# Patient Record
Sex: Female | Born: 1935 | Race: White | Hispanic: No | Smoking: Former smoker
Health system: Southern US, Community
[De-identification: ages and names within clinical notes are randomized; demographics above are authoritative.]

## PROBLEM LIST (undated history)

## (undated) DIAGNOSIS — I10 Essential (primary) hypertension: Secondary | ICD-10-CM

## (undated) DIAGNOSIS — Z860100 Personal history of colon polyps, unspecified: Secondary | ICD-10-CM

## (undated) DIAGNOSIS — F419 Anxiety disorder, unspecified: Secondary | ICD-10-CM

## (undated) DIAGNOSIS — M8580 Other specified disorders of bone density and structure, unspecified site: Secondary | ICD-10-CM

## (undated) DIAGNOSIS — M3500 Sicca syndrome, unspecified: Secondary | ICD-10-CM

## (undated) DIAGNOSIS — E785 Hyperlipidemia, unspecified: Secondary | ICD-10-CM

## (undated) DIAGNOSIS — Z780 Asymptomatic menopausal state: Secondary | ICD-10-CM

## (undated) HISTORY — DX: Other specified disorders of bone density and structure, unspecified site: M85.80

## (undated) HISTORY — DX: Essential (primary) hypertension: I10

## (undated) HISTORY — PX: BREAST LUMPECTOMY: SHX2

## (undated) HISTORY — DX: Sjogren syndrome, unspecified: M35.00

## (undated) HISTORY — DX: Anxiety disorder, unspecified: F41.9

## (undated) HISTORY — DX: Personal history of colonic polyps: Z86.010

## (undated) HISTORY — PX: TONSILLECTOMY: SUR1361

## (undated) HISTORY — PX: VAGINAL HYSTERECTOMY: SUR661

## (undated) HISTORY — DX: Personal history of colon polyps, unspecified: Z86.0100

## (undated) HISTORY — DX: Asymptomatic menopausal state: Z78.0

## (undated) HISTORY — DX: Hyperlipidemia, unspecified: E78.5

---

## 1998-01-28 ENCOUNTER — Other Ambulatory Visit: Admission: RE | Admit: 1998-01-28 | Discharge: 1998-01-28 | Payer: Self-pay | Admitting: Gastroenterology

## 1998-10-13 ENCOUNTER — Other Ambulatory Visit: Admission: RE | Admit: 1998-10-13 | Discharge: 1998-10-13 | Payer: Self-pay | Admitting: Family Medicine

## 1999-04-06 ENCOUNTER — Encounter: Payer: Self-pay | Admitting: Family Medicine

## 1999-04-06 ENCOUNTER — Encounter: Admission: RE | Admit: 1999-04-06 | Discharge: 1999-04-06 | Payer: Self-pay | Admitting: Family Medicine

## 2000-02-28 ENCOUNTER — Other Ambulatory Visit: Admission: RE | Admit: 2000-02-28 | Discharge: 2000-02-28 | Payer: Self-pay | Admitting: Family Medicine

## 2000-06-21 ENCOUNTER — Encounter: Payer: Self-pay | Admitting: *Deleted

## 2000-06-21 ENCOUNTER — Encounter: Admission: RE | Admit: 2000-06-21 | Discharge: 2000-06-21 | Payer: Self-pay | Admitting: *Deleted

## 2001-03-08 ENCOUNTER — Encounter: Payer: Self-pay | Admitting: *Deleted

## 2001-03-08 ENCOUNTER — Encounter: Admission: RE | Admit: 2001-03-08 | Discharge: 2001-03-08 | Payer: Self-pay | Admitting: *Deleted

## 2001-07-15 ENCOUNTER — Encounter: Payer: Self-pay | Admitting: *Deleted

## 2001-07-15 ENCOUNTER — Encounter: Admission: RE | Admit: 2001-07-15 | Discharge: 2001-07-15 | Payer: Self-pay | Admitting: *Deleted

## 2002-10-13 ENCOUNTER — Encounter: Admission: RE | Admit: 2002-10-13 | Discharge: 2002-10-13 | Payer: Self-pay | Admitting: Family Medicine

## 2002-10-13 ENCOUNTER — Encounter: Payer: Self-pay | Admitting: Family Medicine

## 2003-10-14 ENCOUNTER — Encounter: Admission: RE | Admit: 2003-10-14 | Discharge: 2003-10-14 | Payer: Self-pay | Admitting: Family Medicine

## 2005-03-09 ENCOUNTER — Encounter: Admission: RE | Admit: 2005-03-09 | Discharge: 2005-03-09 | Payer: Self-pay | Admitting: Family Medicine

## 2006-03-12 ENCOUNTER — Encounter: Admission: RE | Admit: 2006-03-12 | Discharge: 2006-03-12 | Payer: Self-pay | Admitting: Family Medicine

## 2007-04-19 ENCOUNTER — Encounter: Admission: RE | Admit: 2007-04-19 | Discharge: 2007-04-19 | Payer: Self-pay | Admitting: Family Medicine

## 2009-01-13 ENCOUNTER — Encounter: Admission: RE | Admit: 2009-01-13 | Discharge: 2009-01-13 | Payer: Self-pay | Admitting: Family Medicine

## 2009-05-01 HISTORY — PX: FOOT SURGERY: SHX648

## 2009-08-22 ENCOUNTER — Inpatient Hospital Stay (HOSPITAL_COMMUNITY): Admission: EM | Admit: 2009-08-22 | Discharge: 2009-08-28 | Payer: Self-pay | Admitting: Emergency Medicine

## 2009-09-09 ENCOUNTER — Inpatient Hospital Stay (HOSPITAL_COMMUNITY): Admission: RE | Admit: 2009-09-09 | Discharge: 2009-09-12 | Payer: Self-pay | Admitting: Orthopedic Surgery

## 2010-01-14 ENCOUNTER — Encounter: Admission: RE | Admit: 2010-01-14 | Discharge: 2010-01-14 | Payer: Self-pay | Admitting: Family Medicine

## 2010-05-22 ENCOUNTER — Encounter: Payer: Self-pay | Admitting: Family Medicine

## 2010-07-19 LAB — COMPREHENSIVE METABOLIC PANEL
Albumin: 3.6 g/dL (ref 3.5–5.2)
BUN: 17 mg/dL (ref 6–23)
Chloride: 101 mEq/L (ref 96–112)
Creatinine, Ser: 0.75 mg/dL (ref 0.4–1.2)
Total Bilirubin: 0.8 mg/dL (ref 0.3–1.2)

## 2010-07-19 LAB — TYPE AND SCREEN: Antibody Screen: NEGATIVE

## 2010-07-19 LAB — BASIC METABOLIC PANEL
BUN: 5 mg/dL — ABNORMAL LOW (ref 6–23)
BUN: 7 mg/dL (ref 6–23)
BUN: 13 mg/dL (ref 6–23)
CO2: 26 mEq/L (ref 19–32)
CO2: 26 mEq/L (ref 19–32)
Calcium: 7.8 mg/dL — ABNORMAL LOW (ref 8.4–10.5)
Calcium: 7.9 mg/dL — ABNORMAL LOW (ref 8.4–10.5)
Chloride: 106 mEq/L (ref 96–112)
GFR calc non Af Amer: >60 mL/min (ref >60)
GFR calc non Af Amer: >60 mL/min (ref >60)
GFR calc non Af Amer: >60 mL/min (ref >60)
Glucose, Bld: 107 mg/dL — ABNORMAL HIGH (ref 70–99)
Glucose, Bld: 117 mg/dL — ABNORMAL HIGH (ref 70–99)
Glucose, Bld: 149 mg/dL — ABNORMAL HIGH (ref 70–99)
Potassium: 3.9 mEq/L (ref 3.5–5.1)
Potassium: 4.1 mEq/L (ref 3.5–5.1)
Sodium: 136 mEq/L (ref 135–145)
Sodium: 137 mEq/L (ref 135–145)

## 2010-07-19 LAB — CBC
HCT: 28.4 % — ABNORMAL LOW (ref 36.0–46.0)
HCT: 30.1 % — ABNORMAL LOW (ref 36.0–46.0)
HCT: 37.9 % (ref 36.0–46.0)
HCT: 33.1 % — ABNORMAL LOW (ref 36.0–46.0)
HCT: 36.1 % (ref 36.0–46.0)
Hemoglobin: 13 g/dL (ref 12.0–15.0)
Hemoglobin: 11.6 g/dL — ABNORMAL LOW (ref 12.0–15.0)
Hemoglobin: 12.5 g/dL (ref 12.0–15.0)
MCHC: 34.3 g/dL (ref 30.0–36.0)
MCV: 94.3 fL (ref 78.0–100.0)
Platelets: 217 10*3/uL (ref 150–400)
Platelets: 236 10*3/uL (ref 150–400)
RBC: 4.01 MIL/uL (ref 3.87–5.11)
RBC: 3.82 MIL/uL — ABNORMAL LOW (ref 3.87–5.11)
RDW: 13 % (ref 11.5–15.5)
RDW: 13.1 % (ref 11.5–15.5)
RDW: 13.6 % (ref 11.5–15.5)
RDW: 13.7 % (ref 11.5–15.5)
WBC: 7.5 10*3/uL (ref 4.0–10.5)

## 2010-07-19 LAB — POCT I-STAT, CHEM 8
BUN: 21 mg/dL (ref 6–23)
Chloride: 105 mEq/L (ref 96–112)
Creatinine, Ser: 0.8 mg/dL (ref 0.4–1.2)
Glucose, Bld: 145 mg/dL — ABNORMAL HIGH (ref 70–99)
Hemoglobin: 12.9 g/dL (ref 12.0–15.0)
Potassium: 4.1 mEq/L (ref 3.5–5.1)
Sodium: 138 mEq/L (ref 135–145)

## 2010-07-19 LAB — PROTIME-INR
INR: 1.02 (ref 0.00–1.49)
Prothrombin Time: 13.3 seconds (ref 11.6–15.2)

## 2010-07-19 LAB — DIFFERENTIAL
Basophils Absolute: 0 10*3/uL (ref 0.0–0.1)
Eosinophils Relative: 3 % (ref 0–5)
Lymphocytes Relative: 13 % (ref 12–46)
Lymphocytes Relative: 13 % (ref 12–46)
Lymphs Abs: 0.7 10*3/uL (ref 0.7–4.0)
Monocytes Absolute: 0.5 10*3/uL (ref 0.1–1.0)
Monocytes Absolute: 0.6 10*3/uL (ref 0.1–1.0)
Monocytes Relative: 12 % (ref 3–12)
Neutro Abs: 4.4 10*3/uL (ref 1.7–7.7)
Neutro Abs: 3.9 10*3/uL (ref 1.7–7.7)
Neutrophils Relative %: 75 % (ref 43–77)

## 2010-07-19 LAB — APTT: aPTT: 25 seconds (ref 24–37)

## 2010-07-19 LAB — URINALYSIS, ROUTINE W REFLEX MICROSCOPIC
Ketones, ur: NEGATIVE mg/dL
Nitrite: NEGATIVE
Protein, ur: NEGATIVE mg/dL
Urobilinogen, UA: 0.2 mg/dL (ref 0.0–1.0)
pH: 5.5 (ref 5.0–8.0)

## 2010-07-19 LAB — URINE CULTURE: Colony Count: 9000

## 2010-12-13 ENCOUNTER — Other Ambulatory Visit: Payer: Self-pay | Admitting: Family Medicine

## 2010-12-13 DIAGNOSIS — Z1231 Encounter for screening mammogram for malignant neoplasm of breast: Secondary | ICD-10-CM

## 2010-12-23 ENCOUNTER — Encounter: Payer: Self-pay | Admitting: Internal Medicine

## 2010-12-26 ENCOUNTER — Encounter: Payer: Self-pay | Admitting: Internal Medicine

## 2011-01-17 ENCOUNTER — Ambulatory Visit (AMBULATORY_SURGERY_CENTER): Payer: Medicare Other | Admitting: *Deleted

## 2011-01-17 ENCOUNTER — Ambulatory Visit: Payer: Self-pay

## 2011-01-17 ENCOUNTER — Encounter: Payer: Self-pay | Admitting: Internal Medicine

## 2011-01-17 VITALS — Ht 61.0 in | Wt 114.0 lb

## 2011-01-17 DIAGNOSIS — Z1211 Encounter for screening for malignant neoplasm of colon: Secondary | ICD-10-CM

## 2011-01-17 MED ORDER — PEG-KCL-NACL-NASULF-NA ASC-C 100 G PO SOLR: 1.0000 | Freq: Once | ORAL | Status: AC

## 2011-01-27 ENCOUNTER — Ambulatory Visit
Admission: RE | Admit: 2011-01-27 | Discharge: 2011-01-27 | Disposition: A | Discharge status: Home / Self Care | Payer: Medicare Other | Source: Ambulatory Visit | Attending: Family Medicine | Admitting: Family Medicine

## 2011-01-27 DIAGNOSIS — Z1231 Encounter for screening mammogram for malignant neoplasm of breast: Secondary | ICD-10-CM

## 2011-01-31 ENCOUNTER — Encounter: Payer: Self-pay | Admitting: Internal Medicine

## 2011-01-31 ENCOUNTER — Ambulatory Visit (AMBULATORY_SURGERY_CENTER): Payer: Medicare Other | Admitting: Internal Medicine

## 2011-01-31 VITALS — BP 168/74 | HR 59 | Temp 96.6°F | Resp 28 | Ht 61.0 in | Wt 114.0 lb

## 2011-01-31 DIAGNOSIS — K648 Other hemorrhoids: Secondary | ICD-10-CM

## 2011-01-31 DIAGNOSIS — Z86010 Personal history of colonic polyps: Secondary | ICD-10-CM

## 2011-01-31 DIAGNOSIS — K5730 Diverticulosis of large intestine without perforation or abscess without bleeding: Secondary | ICD-10-CM

## 2011-01-31 DIAGNOSIS — Z1211 Encounter for screening for malignant neoplasm of colon: Secondary | ICD-10-CM

## 2011-01-31 DIAGNOSIS — Z800 Family history of malignant neoplasm of digestive organs: Secondary | ICD-10-CM

## 2011-01-31 DIAGNOSIS — D126 Benign neoplasm of colon, unspecified: Secondary | ICD-10-CM

## 2011-01-31 HISTORY — PX: COLONOSCOPY: SHX174

## 2011-01-31 MED ORDER — SODIUM CHLORIDE 0.9 % IV SOLN: 500.0000 mL | INTRAVENOUS | Status: DC

## 2011-01-31 NOTE — Progress Notes (Signed)
Pt tolerated the colon exam very well. maw 

## 2011-01-31 NOTE — Patient Instructions (Addendum)
One tiny polyp was removed. It looks benign. I will send a letter explaining the analysis after pathology report is sent to me. You have mild diverticulosis and small hemorrhoids also. Iva Boop, MD, [facility] Resume all medications.Information given on polyps,diverticulosis,hemorrhoids and high fiber diet.

## 2011-02-01 ENCOUNTER — Telehealth: Payer: Self-pay | Admitting: *Deleted

## 2011-02-01 NOTE — Telephone Encounter (Signed)

## 2011-02-07 ENCOUNTER — Encounter: Payer: Self-pay | Admitting: Internal Medicine

## 2011-02-07 NOTE — Progress Notes (Signed)
Quick Note:  Diminutive adenoma - repeat colonoscopy 7 years 2019 - if vigorous ______

## 2011-12-26 ENCOUNTER — Other Ambulatory Visit: Payer: Self-pay | Admitting: Family Medicine

## 2011-12-26 DIAGNOSIS — Z1231 Encounter for screening mammogram for malignant neoplasm of breast: Secondary | ICD-10-CM

## 2012-01-30 ENCOUNTER — Ambulatory Visit
Admission: RE | Admit: 2012-01-30 | Discharge: 2012-01-30 | Disposition: A | Discharge status: Home / Self Care | Payer: Medicare Other | Source: Ambulatory Visit | Attending: Family Medicine | Admitting: Family Medicine

## 2012-01-30 DIAGNOSIS — Z1231 Encounter for screening mammogram for malignant neoplasm of breast: Secondary | ICD-10-CM

## 2012-02-01 ENCOUNTER — Other Ambulatory Visit: Payer: Self-pay | Admitting: Family Medicine

## 2012-02-01 DIAGNOSIS — R928 Other abnormal and inconclusive findings on diagnostic imaging of breast: Secondary | ICD-10-CM

## 2012-02-07 ENCOUNTER — Ambulatory Visit
Admission: RE | Admit: 2012-02-07 | Discharge: 2012-02-07 | Disposition: A | Discharge status: Home / Self Care | Payer: Medicare Other | Source: Ambulatory Visit | Attending: Family Medicine | Admitting: Family Medicine

## 2012-02-07 DIAGNOSIS — R928 Other abnormal and inconclusive findings on diagnostic imaging of breast: Secondary | ICD-10-CM

## 2012-05-01 HISTORY — PX: BREAST BIOPSY: SHX20

## 2012-07-03 ENCOUNTER — Other Ambulatory Visit: Payer: Self-pay | Admitting: Family Medicine

## 2012-07-03 DIAGNOSIS — N6489 Other specified disorders of breast: Secondary | ICD-10-CM

## 2012-08-05 ENCOUNTER — Ambulatory Visit
Admission: RE | Admit: 2012-08-05 | Discharge: 2012-08-05 | Disposition: A | Discharge status: Home / Self Care | Payer: Medicare PPO | Source: Ambulatory Visit | Attending: Family Medicine | Admitting: Family Medicine

## 2012-08-05 DIAGNOSIS — N6489 Other specified disorders of breast: Secondary | ICD-10-CM

## 2013-01-08 ENCOUNTER — Other Ambulatory Visit: Payer: Self-pay

## 2013-01-08 DIAGNOSIS — Z1231 Encounter for screening mammogram for malignant neoplasm of breast: Secondary | ICD-10-CM

## 2013-01-30 ENCOUNTER — Ambulatory Visit
Admission: RE | Admit: 2013-01-30 | Discharge: 2013-01-30 | Disposition: A | Discharge status: Home / Self Care | Payer: Medicare PPO | Source: Ambulatory Visit

## 2013-01-30 DIAGNOSIS — Z1231 Encounter for screening mammogram for malignant neoplasm of breast: Secondary | ICD-10-CM

## 2013-02-03 ENCOUNTER — Other Ambulatory Visit: Payer: Self-pay | Admitting: Family Medicine

## 2013-02-03 DIAGNOSIS — R928 Other abnormal and inconclusive findings on diagnostic imaging of breast: Secondary | ICD-10-CM

## 2013-03-01 HISTORY — PX: BREAST LUMPECTOMY: SHX2

## 2013-03-06 ENCOUNTER — Other Ambulatory Visit: Payer: Self-pay | Admitting: Family Medicine

## 2013-03-06 ENCOUNTER — Ambulatory Visit
Admission: RE | Admit: 2013-03-06 | Discharge: 2013-03-06 | Disposition: A | Discharge status: Home / Self Care | Payer: Medicare PPO | Source: Ambulatory Visit | Attending: Family Medicine | Admitting: Family Medicine

## 2013-03-06 DIAGNOSIS — R928 Other abnormal and inconclusive findings on diagnostic imaging of breast: Secondary | ICD-10-CM

## 2013-03-06 DIAGNOSIS — N6310 Unspecified lump in the right breast, unspecified quadrant: Secondary | ICD-10-CM

## 2013-03-12 ENCOUNTER — Ambulatory Visit
Admission: RE | Admit: 2013-03-12 | Discharge: 2013-03-12 | Disposition: A | Discharge status: Home / Self Care | Payer: Medicare PPO | Source: Ambulatory Visit | Attending: Family Medicine | Admitting: Family Medicine

## 2013-03-12 ENCOUNTER — Other Ambulatory Visit: Payer: Self-pay | Admitting: Family Medicine

## 2013-03-12 DIAGNOSIS — N6310 Unspecified lump in the right breast, unspecified quadrant: Secondary | ICD-10-CM

## 2013-03-26 DIAGNOSIS — M3501 Sicca syndrome with keratoconjunctivitis: Secondary | ICD-10-CM | POA: Insufficient documentation

## 2013-03-26 DIAGNOSIS — I10 Essential (primary) hypertension: Secondary | ICD-10-CM | POA: Diagnosis present

## 2013-03-26 DIAGNOSIS — C50911 Malignant neoplasm of unspecified site of right female breast: Secondary | ICD-10-CM | POA: Insufficient documentation

## 2014-02-17 ENCOUNTER — Encounter: Payer: Self-pay | Admitting: *Deleted

## 2014-05-12 DIAGNOSIS — H3531 Nonexudative age-related macular degeneration: Secondary | ICD-10-CM | POA: Diagnosis not present

## 2014-05-13 DIAGNOSIS — C50911 Malignant neoplasm of unspecified site of right female breast: Secondary | ICD-10-CM | POA: Diagnosis not present

## 2014-05-13 DIAGNOSIS — Z9889 Other specified postprocedural states: Secondary | ICD-10-CM | POA: Diagnosis not present

## 2014-05-13 DIAGNOSIS — M8580 Other specified disorders of bone density and structure, unspecified site: Secondary | ICD-10-CM | POA: Diagnosis not present

## 2014-05-13 DIAGNOSIS — Z170 Estrogen receptor positive status [ER+]: Secondary | ICD-10-CM | POA: Diagnosis not present

## 2014-09-16 DIAGNOSIS — H5203 Hypermetropia, bilateral: Secondary | ICD-10-CM | POA: Diagnosis not present

## 2014-09-16 DIAGNOSIS — H5210 Myopia, unspecified eye: Secondary | ICD-10-CM | POA: Diagnosis not present

## 2014-09-16 DIAGNOSIS — Z79899 Other long term (current) drug therapy: Secondary | ICD-10-CM | POA: Diagnosis not present

## 2014-09-17 DIAGNOSIS — E559 Vitamin D deficiency, unspecified: Secondary | ICD-10-CM | POA: Diagnosis not present

## 2014-09-17 DIAGNOSIS — E780 Pure hypercholesterolemia: Secondary | ICD-10-CM | POA: Diagnosis not present

## 2014-09-17 DIAGNOSIS — N183 Chronic kidney disease, stage 3 (moderate): Secondary | ICD-10-CM | POA: Diagnosis not present

## 2014-09-17 DIAGNOSIS — M8588 Other specified disorders of bone density and structure, other site: Secondary | ICD-10-CM | POA: Diagnosis not present

## 2014-09-17 DIAGNOSIS — I10 Essential (primary) hypertension: Secondary | ICD-10-CM | POA: Diagnosis not present

## 2014-09-17 DIAGNOSIS — F419 Anxiety disorder, unspecified: Secondary | ICD-10-CM | POA: Diagnosis not present

## 2014-09-17 DIAGNOSIS — Z23 Encounter for immunization: Secondary | ICD-10-CM | POA: Diagnosis not present

## 2014-09-17 DIAGNOSIS — L570 Actinic keratosis: Secondary | ICD-10-CM | POA: Diagnosis not present

## 2014-09-18 DIAGNOSIS — M3500 Sicca syndrome, unspecified: Secondary | ICD-10-CM | POA: Diagnosis not present

## 2014-09-18 DIAGNOSIS — I7300 Raynaud's syndrome without gangrene: Secondary | ICD-10-CM | POA: Diagnosis not present

## 2014-09-18 DIAGNOSIS — M1990 Unspecified osteoarthritis, unspecified site: Secondary | ICD-10-CM | POA: Diagnosis not present

## 2014-09-18 DIAGNOSIS — Z79899 Other long term (current) drug therapy: Secondary | ICD-10-CM | POA: Diagnosis not present

## 2014-09-18 DIAGNOSIS — M2550 Pain in unspecified joint: Secondary | ICD-10-CM | POA: Diagnosis not present

## 2014-10-15 DIAGNOSIS — M899 Disorder of bone, unspecified: Secondary | ICD-10-CM | POA: Diagnosis not present

## 2014-10-15 DIAGNOSIS — M8589 Other specified disorders of bone density and structure, multiple sites: Secondary | ICD-10-CM | POA: Diagnosis not present

## 2014-11-10 DIAGNOSIS — H3531 Nonexudative age-related macular degeneration: Secondary | ICD-10-CM | POA: Diagnosis not present

## 2014-11-10 DIAGNOSIS — H43823 Vitreomacular adhesion, bilateral: Secondary | ICD-10-CM | POA: Diagnosis not present

## 2014-11-13 DIAGNOSIS — N644 Mastodynia: Secondary | ICD-10-CM | POA: Diagnosis not present

## 2014-11-13 DIAGNOSIS — Z9889 Other specified postprocedural states: Secondary | ICD-10-CM | POA: Diagnosis not present

## 2014-11-13 DIAGNOSIS — Z853 Personal history of malignant neoplasm of breast: Secondary | ICD-10-CM | POA: Diagnosis not present

## 2014-11-13 DIAGNOSIS — Z170 Estrogen receptor positive status [ER+]: Secondary | ICD-10-CM | POA: Diagnosis not present

## 2014-11-13 DIAGNOSIS — C50911 Malignant neoplasm of unspecified site of right female breast: Secondary | ICD-10-CM | POA: Diagnosis not present

## 2014-11-13 DIAGNOSIS — M8580 Other specified disorders of bone density and structure, unspecified site: Secondary | ICD-10-CM | POA: Diagnosis not present

## 2014-11-13 DIAGNOSIS — Z79811 Long term (current) use of aromatase inhibitors: Secondary | ICD-10-CM | POA: Diagnosis not present

## 2014-12-17 DIAGNOSIS — N3000 Acute cystitis without hematuria: Secondary | ICD-10-CM | POA: Diagnosis not present

## 2014-12-17 DIAGNOSIS — R300 Dysuria: Secondary | ICD-10-CM | POA: Diagnosis not present

## 2015-01-13 DIAGNOSIS — K111 Hypertrophy of salivary gland: Secondary | ICD-10-CM | POA: Diagnosis not present

## 2015-01-13 DIAGNOSIS — R350 Frequency of micturition: Secondary | ICD-10-CM | POA: Diagnosis not present

## 2015-01-13 DIAGNOSIS — Z23 Encounter for immunization: Secondary | ICD-10-CM | POA: Diagnosis not present

## 2015-01-13 DIAGNOSIS — N390 Urinary tract infection, site not specified: Secondary | ICD-10-CM | POA: Diagnosis not present

## 2015-03-02 DIAGNOSIS — N390 Urinary tract infection, site not specified: Secondary | ICD-10-CM | POA: Diagnosis not present

## 2015-03-02 DIAGNOSIS — R221 Localized swelling, mass and lump, neck: Secondary | ICD-10-CM | POA: Diagnosis not present

## 2015-03-10 DIAGNOSIS — K118 Other diseases of salivary glands: Secondary | ICD-10-CM | POA: Diagnosis not present

## 2015-03-10 DIAGNOSIS — M3500 Sicca syndrome, unspecified: Secondary | ICD-10-CM | POA: Diagnosis not present

## 2015-03-12 ENCOUNTER — Other Ambulatory Visit: Payer: Self-pay | Admitting: Otolaryngology

## 2015-03-12 DIAGNOSIS — M3500 Sicca syndrome, unspecified: Secondary | ICD-10-CM

## 2015-03-12 DIAGNOSIS — R609 Edema, unspecified: Secondary | ICD-10-CM

## 2015-03-17 DIAGNOSIS — Z79899 Other long term (current) drug therapy: Secondary | ICD-10-CM | POA: Diagnosis not present

## 2015-03-18 DIAGNOSIS — M3500 Sicca syndrome, unspecified: Secondary | ICD-10-CM | POA: Diagnosis not present

## 2015-03-18 DIAGNOSIS — K119 Disease of salivary gland, unspecified: Secondary | ICD-10-CM | POA: Diagnosis not present

## 2015-03-18 DIAGNOSIS — Z79899 Other long term (current) drug therapy: Secondary | ICD-10-CM | POA: Diagnosis not present

## 2015-03-18 DIAGNOSIS — I7300 Raynaud's syndrome without gangrene: Secondary | ICD-10-CM | POA: Diagnosis not present

## 2015-03-18 DIAGNOSIS — M7062 Trochanteric bursitis, left hip: Secondary | ICD-10-CM | POA: Diagnosis not present

## 2015-03-19 DIAGNOSIS — F419 Anxiety disorder, unspecified: Secondary | ICD-10-CM | POA: Diagnosis not present

## 2015-03-19 DIAGNOSIS — M8588 Other specified disorders of bone density and structure, other site: Secondary | ICD-10-CM | POA: Diagnosis not present

## 2015-03-19 DIAGNOSIS — M3500 Sicca syndrome, unspecified: Secondary | ICD-10-CM | POA: Diagnosis not present

## 2015-03-19 DIAGNOSIS — N183 Chronic kidney disease, stage 3 (moderate): Secondary | ICD-10-CM | POA: Diagnosis not present

## 2015-03-19 DIAGNOSIS — E7800 Pure hypercholesterolemia, unspecified: Secondary | ICD-10-CM | POA: Diagnosis not present

## 2015-03-19 DIAGNOSIS — I10 Essential (primary) hypertension: Secondary | ICD-10-CM | POA: Diagnosis not present

## 2015-03-22 ENCOUNTER — Ambulatory Visit
Admission: RE | Admit: 2015-03-22 | Discharge: 2015-03-22 | Disposition: A | Discharge status: Home / Self Care | Payer: Commercial Managed Care - HMO | Source: Ambulatory Visit | Attending: Otolaryngology | Admitting: Otolaryngology

## 2015-03-22 DIAGNOSIS — R609 Edema, unspecified: Secondary | ICD-10-CM

## 2015-03-22 DIAGNOSIS — M3500 Sicca syndrome, unspecified: Secondary | ICD-10-CM

## 2015-03-22 DIAGNOSIS — K1120 Sialoadenitis, unspecified: Secondary | ICD-10-CM | POA: Diagnosis not present

## 2015-03-22 MED ORDER — IOPAMIDOL (ISOVUE-300) INJECTION 61%
75.0000 mL | Freq: Once | INTRAVENOUS | Status: AC | PRN
  Administered 2015-03-22: 75 mL via INTRAVENOUS

## 2015-03-30 DIAGNOSIS — M3500 Sicca syndrome, unspecified: Secondary | ICD-10-CM | POA: Diagnosis not present

## 2015-03-30 DIAGNOSIS — K118 Other diseases of salivary glands: Secondary | ICD-10-CM | POA: Diagnosis not present

## 2015-05-20 DIAGNOSIS — Z79899 Other long term (current) drug therapy: Secondary | ICD-10-CM | POA: Diagnosis not present

## 2015-05-28 DIAGNOSIS — C50911 Malignant neoplasm of unspecified site of right female breast: Secondary | ICD-10-CM | POA: Diagnosis not present

## 2015-05-28 DIAGNOSIS — Z170 Estrogen receptor positive status [ER+]: Secondary | ICD-10-CM | POA: Diagnosis not present

## 2015-05-28 DIAGNOSIS — Z9889 Other specified postprocedural states: Secondary | ICD-10-CM | POA: Diagnosis not present

## 2015-05-28 DIAGNOSIS — M8580 Other specified disorders of bone density and structure, unspecified site: Secondary | ICD-10-CM | POA: Diagnosis not present

## 2015-05-28 DIAGNOSIS — C50012 Malignant neoplasm of nipple and areola, left female breast: Secondary | ICD-10-CM | POA: Diagnosis not present

## 2015-06-25 DIAGNOSIS — K118 Other diseases of salivary glands: Secondary | ICD-10-CM | POA: Diagnosis not present

## 2015-07-22 DIAGNOSIS — Z79899 Other long term (current) drug therapy: Secondary | ICD-10-CM | POA: Diagnosis not present

## 2015-07-28 DIAGNOSIS — I7300 Raynaud's syndrome without gangrene: Secondary | ICD-10-CM | POA: Diagnosis not present

## 2015-07-28 DIAGNOSIS — Z79899 Other long term (current) drug therapy: Secondary | ICD-10-CM | POA: Diagnosis not present

## 2015-07-28 DIAGNOSIS — K119 Disease of salivary gland, unspecified: Secondary | ICD-10-CM | POA: Diagnosis not present

## 2015-07-28 DIAGNOSIS — M3500 Sicca syndrome, unspecified: Secondary | ICD-10-CM | POA: Diagnosis not present

## 2015-08-23 DIAGNOSIS — I10 Essential (primary) hypertension: Secondary | ICD-10-CM | POA: Diagnosis not present

## 2015-08-25 DIAGNOSIS — R42 Dizziness and giddiness: Secondary | ICD-10-CM | POA: Diagnosis not present

## 2015-08-25 DIAGNOSIS — R5383 Other fatigue: Secondary | ICD-10-CM | POA: Diagnosis not present

## 2015-08-26 ENCOUNTER — Inpatient Hospital Stay (HOSPITAL_COMMUNITY)
Admission: EM | Admit: 2015-08-26 | Discharge: 2015-09-01 | DRG: 641 | Disposition: A | Discharge status: Skilled Nursing Facility | Payer: Commercial Managed Care - HMO | Attending: Internal Medicine | Admitting: Internal Medicine

## 2015-08-26 ENCOUNTER — Encounter (HOSPITAL_COMMUNITY): Payer: Self-pay | Admitting: *Deleted

## 2015-08-26 ENCOUNTER — Emergency Department (HOSPITAL_COMMUNITY): Payer: Commercial Managed Care - HMO

## 2015-08-26 DIAGNOSIS — W19XXXA Unspecified fall, initial encounter: Secondary | ICD-10-CM | POA: Diagnosis not present

## 2015-08-26 DIAGNOSIS — E785 Hyperlipidemia, unspecified: Secondary | ICD-10-CM | POA: Diagnosis present

## 2015-08-26 DIAGNOSIS — Z8041 Family history of malignant neoplasm of ovary: Secondary | ICD-10-CM | POA: Diagnosis not present

## 2015-08-26 DIAGNOSIS — W1830XA Fall on same level, unspecified, initial encounter: Secondary | ICD-10-CM | POA: Diagnosis present

## 2015-08-26 DIAGNOSIS — Z79899 Other long term (current) drug therapy: Secondary | ICD-10-CM | POA: Diagnosis not present

## 2015-08-26 DIAGNOSIS — F419 Anxiety disorder, unspecified: Secondary | ICD-10-CM | POA: Diagnosis present

## 2015-08-26 DIAGNOSIS — R627 Adult failure to thrive: Secondary | ICD-10-CM | POA: Diagnosis not present

## 2015-08-26 DIAGNOSIS — Z882 Allergy status to sulfonamides status: Secondary | ICD-10-CM

## 2015-08-26 DIAGNOSIS — M6281 Muscle weakness (generalized): Secondary | ICD-10-CM | POA: Diagnosis not present

## 2015-08-26 DIAGNOSIS — Z87891 Personal history of nicotine dependence: Secondary | ICD-10-CM | POA: Diagnosis not present

## 2015-08-26 DIAGNOSIS — Y92009 Unspecified place in unspecified non-institutional (private) residence as the place of occurrence of the external cause: Secondary | ICD-10-CM | POA: Diagnosis not present

## 2015-08-26 DIAGNOSIS — R296 Repeated falls: Secondary | ICD-10-CM | POA: Diagnosis present

## 2015-08-26 DIAGNOSIS — Z833 Family history of diabetes mellitus: Secondary | ICD-10-CM

## 2015-08-26 DIAGNOSIS — Z86010 Personal history of colonic polyps: Secondary | ICD-10-CM | POA: Diagnosis not present

## 2015-08-26 DIAGNOSIS — R2681 Unsteadiness on feet: Secondary | ICD-10-CM | POA: Diagnosis present

## 2015-08-26 DIAGNOSIS — E860 Dehydration: Secondary | ICD-10-CM | POA: Diagnosis not present

## 2015-08-26 DIAGNOSIS — R278 Other lack of coordination: Secondary | ICD-10-CM | POA: Diagnosis not present

## 2015-08-26 DIAGNOSIS — Z8249 Family history of ischemic heart disease and other diseases of the circulatory system: Secondary | ICD-10-CM | POA: Diagnosis not present

## 2015-08-26 DIAGNOSIS — M8580 Other specified disorders of bone density and structure, unspecified site: Secondary | ICD-10-CM | POA: Diagnosis present

## 2015-08-26 DIAGNOSIS — R739 Hyperglycemia, unspecified: Secondary | ICD-10-CM | POA: Diagnosis present

## 2015-08-26 DIAGNOSIS — M3500 Sicca syndrome, unspecified: Secondary | ICD-10-CM | POA: Diagnosis present

## 2015-08-26 DIAGNOSIS — M069 Rheumatoid arthritis, unspecified: Secondary | ICD-10-CM | POA: Diagnosis not present

## 2015-08-26 DIAGNOSIS — R262 Difficulty in walking, not elsewhere classified: Secondary | ICD-10-CM | POA: Diagnosis not present

## 2015-08-26 DIAGNOSIS — D72829 Elevated white blood cell count, unspecified: Secondary | ICD-10-CM | POA: Diagnosis present

## 2015-08-26 DIAGNOSIS — E876 Hypokalemia: Secondary | ICD-10-CM | POA: Diagnosis not present

## 2015-08-26 DIAGNOSIS — S0990XA Unspecified injury of head, initial encounter: Secondary | ICD-10-CM | POA: Diagnosis not present

## 2015-08-26 DIAGNOSIS — I10 Essential (primary) hypertension: Secondary | ICD-10-CM | POA: Diagnosis not present

## 2015-08-26 DIAGNOSIS — E871 Hypo-osmolality and hyponatremia: Secondary | ICD-10-CM | POA: Diagnosis not present

## 2015-08-26 DIAGNOSIS — R531 Weakness: Secondary | ICD-10-CM

## 2015-08-26 DIAGNOSIS — R404 Transient alteration of awareness: Secondary | ICD-10-CM | POA: Diagnosis not present

## 2015-08-26 DIAGNOSIS — R42 Dizziness and giddiness: Secondary | ICD-10-CM | POA: Diagnosis not present

## 2015-08-26 DIAGNOSIS — Y92099 Unspecified place in other non-institutional residence as the place of occurrence of the external cause: Secondary | ICD-10-CM

## 2015-08-26 LAB — URINALYSIS, ROUTINE W REFLEX MICROSCOPIC
BILIRUBIN URINE: NEGATIVE
Glucose, UA: 250 mg/dL — AB
KETONES UR: NEGATIVE mg/dL
NITRITE: NEGATIVE
PROTEIN: NEGATIVE mg/dL
SPECIFIC GRAVITY, URINE: 1.018 (ref 1.005–1.030)
pH: 6 (ref 5.0–8.0)

## 2015-08-26 LAB — URINE MICROSCOPIC-ADD ON: Squamous Epithelial / LPF: NONE SEEN

## 2015-08-26 LAB — CBG MONITORING, ED: GLUCOSE-CAPILLARY: 158 mg/dL — AB (ref 65–99)

## 2015-08-26 LAB — CBC
HCT: 35.7 % — ABNORMAL LOW (ref 36.0–46.0)
HEMOGLOBIN: 13.1 g/dL (ref 12.0–15.0)
MCH: 30.8 pg (ref 26.0–34.0)
MCHC: 36.7 g/dL — AB (ref 30.0–36.0)
MCV: 83.8 fL (ref 78.0–100.0)
PLATELETS: 173 10*3/uL (ref 150–400)
RBC: 4.26 MIL/uL (ref 3.87–5.11)
RDW: 12.4 % (ref 11.5–15.5)
WBC: 12.2 10*3/uL — ABNORMAL HIGH (ref 4.0–10.5)

## 2015-08-26 LAB — MAGNESIUM: MAGNESIUM: 1.7 mg/dL (ref 1.7–2.4)

## 2015-08-26 LAB — OSMOLALITY: OSMOLALITY: 243 mosm/kg — AB (ref 275–295)

## 2015-08-26 LAB — BASIC METABOLIC PANEL
ANION GAP: 15 (ref 5–15)
BUN: 25 mg/dL — AB (ref 6–20)
CALCIUM: 8.4 mg/dL — AB (ref 8.9–10.3)
CO2: 25 mmol/L (ref 22–32)
CREATININE: 0.79 mg/dL (ref 0.44–1.00)
Chloride: 75 mmol/L — ABNORMAL LOW (ref 101–111)
GFR calc Af Amer: >60 mL/min (ref >60)
GLUCOSE: 152 mg/dL — AB (ref 65–99)
Potassium: 2.1 mmol/L — CL (ref 3.5–5.1)
Sodium: 115 mmol/L — CL (ref 135–145)

## 2015-08-26 MED ORDER — ONDANSETRON HCL 4 MG/2ML IJ SOLN
4.0000 mg | Freq: Four times a day (QID) | INTRAMUSCULAR | Status: DC | PRN
  Administered 2015-08-26: 4 mg via INTRAVENOUS
  Filled 2015-08-26: qty 2

## 2015-08-26 MED ORDER — CYCLOSPORINE 0.05 % OP EMUL
1.0000 [drp] | Freq: Two times a day (BID) | OPHTHALMIC | Status: DC
  Administered 2015-08-27 – 2015-08-31 (×9): 1 [drp] via OPHTHALMIC
  Filled 2015-08-26 (×13): qty 1

## 2015-08-26 MED ORDER — NYSTATIN 100000 UNIT/ML MT SUSP
5.0000 mL | Freq: Three times a day (TID) | OROMUCOSAL | Status: DC
  Administered 2015-08-26 – 2015-09-01 (×21): 500000 [IU] via ORAL
  Filled 2015-08-26 (×22): qty 5

## 2015-08-26 MED ORDER — METOPROLOL TARTRATE 50 MG PO TABS
100.0000 mg | ORAL_TABLET | Freq: Two times a day (BID) | ORAL | Status: DC
  Administered 2015-08-26 – 2015-09-01 (×12): 100 mg via ORAL
  Filled 2015-08-26 (×12): qty 2

## 2015-08-26 MED ORDER — ALPRAZOLAM 0.25 MG PO TABS
0.2500 mg | ORAL_TABLET | Freq: Every evening | ORAL | Status: DC | PRN
  Administered 2015-08-26 – 2015-08-28 (×3): 0.25 mg via ORAL
  Filled 2015-08-26 (×3): qty 1

## 2015-08-26 MED ORDER — ACETAMINOPHEN 650 MG RE SUPP: 650.0000 mg | Freq: Four times a day (QID) | RECTAL | Status: DC | PRN

## 2015-08-26 MED ORDER — SODIUM CHLORIDE 0.9 % IV SOLN
INTRAVENOUS | Status: DC
  Administered 2015-08-26 – 2015-08-31 (×9): via INTRAVENOUS

## 2015-08-26 MED ORDER — SODIUM CHLORIDE 0.9 % IV SOLN
Freq: Once | INTRAVENOUS | Status: AC
  Administered 2015-08-26: 12:00:00 via INTRAVENOUS

## 2015-08-26 MED ORDER — POTASSIUM CHLORIDE 10 MEQ/100ML IV SOLN
10.0000 meq | Freq: Once | INTRAVENOUS | Status: AC
  Administered 2015-08-26: 10 meq via INTRAVENOUS
  Filled 2015-08-26: qty 100

## 2015-08-26 MED ORDER — ACETAMINOPHEN 325 MG PO TABS: 650.0000 mg | ORAL_TABLET | Freq: Four times a day (QID) | ORAL | Status: DC | PRN

## 2015-08-26 MED ORDER — SODIUM CHLORIDE 0.9% FLUSH
3.0000 mL | Freq: Two times a day (BID) | INTRAVENOUS | Status: DC
  Administered 2015-08-27 – 2015-08-30 (×5): 3 mL via INTRAVENOUS

## 2015-08-26 MED ORDER — RAMIPRIL 10 MG PO CAPS
10.0000 mg | ORAL_CAPSULE | Freq: Every day | ORAL | Status: DC
  Administered 2015-08-26 – 2015-09-01 (×7): 10 mg via ORAL
  Filled 2015-08-26 (×7): qty 1

## 2015-08-26 MED ORDER — HYDROXYCHLOROQUINE SULFATE 200 MG PO TABS
200.0000 mg | ORAL_TABLET | Freq: Every day | ORAL | Status: DC
  Administered 2015-08-26 – 2015-09-01 (×7): 200 mg via ORAL
  Filled 2015-08-26 (×7): qty 1

## 2015-08-26 MED ORDER — CALCIUM 1200 1200-1000 MG-UNIT PO CHEW: CHEWABLE_TABLET | Freq: Every day | ORAL | Status: DC

## 2015-08-26 MED ORDER — POTASSIUM CHLORIDE CRYS ER 20 MEQ PO TBCR
40.0000 meq | EXTENDED_RELEASE_TABLET | Freq: Once | ORAL | Status: AC
  Administered 2015-08-26: 40 meq via ORAL
  Filled 2015-08-26: qty 2

## 2015-08-26 MED ORDER — ENOXAPARIN SODIUM 40 MG/0.4ML ~~LOC~~ SOLN
40.0000 mg | SUBCUTANEOUS | Status: DC
  Administered 2015-08-26 – 2015-08-31 (×6): 40 mg via SUBCUTANEOUS
  Filled 2015-08-26 (×6): qty 0.4

## 2015-08-26 MED ORDER — POTASSIUM CHLORIDE 10 MEQ/100ML IV SOLN
10.0000 meq | INTRAVENOUS | Status: AC
  Administered 2015-08-26 (×6): 10 meq via INTRAVENOUS
  Filled 2015-08-26 (×5): qty 100

## 2015-08-26 MED ORDER — CALCIUM CARBONATE ANTACID 500 MG PO CHEW
1.0000 | CHEWABLE_TABLET | Freq: Every day | ORAL | Status: DC
  Administered 2015-08-27 – 2015-09-01 (×6): 200 mg via ORAL
  Filled 2015-08-26 (×6): qty 1
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
## 2015-08-26 NOTE — ED Notes (Signed)
Per EMS pt from home with c/o generalized weakness, nausea x 6 days, was seen by PCP for same, this morning got out of bed, felt weak and fell hitting head, there is swelling to left side of head, denies pain.

## 2015-08-26 NOTE — Progress Notes (Signed)
CRITICAL VALUE ALERT  Critical value received: Serum Osmolality 243   Date of notification:  08/26/15  Time of notification:  2105  Critical value read back:yes  Nurse who received alert:  Harlene Ramus  MD notified (1st page):  [place]  Time of first page: 2105  MD notified (2nd page):   Time of second page:  Responding MD:  Rachelle Hora  Time MD responded:  2105
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
## 2015-08-26 NOTE — ED Notes (Signed)
PT have been made aware of urine sample 

## 2015-08-26 NOTE — ED Notes (Signed)
Bed: WA04 Expected date:  Expected time:  Means of arrival:  Comments: EMS-syncope

## 2015-08-26 NOTE — ED Notes (Signed)
PT state she will not be able to walk to restroom and request for bedpan

## 2015-08-27 LAB — BASIC METABOLIC PANEL
Anion gap: 8 (ref 5–15)
BUN: 15 mg/dL (ref 6–20)
CO2: 24 mmol/L (ref 22–32)
CREATININE: 0.6 mg/dL (ref 0.44–1.00)
Calcium: 7.8 mg/dL — ABNORMAL LOW (ref 8.9–10.3)
Chloride: 86 mmol/L — ABNORMAL LOW (ref 101–111)
GFR calc Af Amer: >60 mL/min (ref >60)
GLUCOSE: 108 mg/dL — AB (ref 65–99)
POTASSIUM: 3.5 mmol/L (ref 3.5–5.1)
SODIUM: 118 mmol/L — AB (ref 135–145)

## 2015-08-27 LAB — CBC
HEMATOCRIT: 34.7 % — AB (ref 36.0–46.0)
Hemoglobin: 12.6 g/dL (ref 12.0–15.0)
MCH: 30.8 pg (ref 26.0–34.0)
MCHC: 36.3 g/dL — AB (ref 30.0–36.0)
MCV: 84.8 fL (ref 78.0–100.0)
Platelets: 171 10*3/uL (ref 150–400)
RBC: 4.09 MIL/uL (ref 3.87–5.11)
RDW: 12.6 % (ref 11.5–15.5)
WBC: 12.2 10*3/uL — AB (ref 4.0–10.5)

## 2015-08-27 LAB — SODIUM, URINE, RANDOM: SODIUM UR: 21 mmol/L

## 2015-08-27 LAB — OSMOLALITY, URINE: Osmolality, Ur: 557 mOsm/kg (ref 300–900)

## 2015-08-27 MED ORDER — POTASSIUM CHLORIDE CRYS ER 20 MEQ PO TBCR
40.0000 meq | EXTENDED_RELEASE_TABLET | Freq: Once | ORAL | Status: AC
  Administered 2015-08-27: 40 meq via ORAL
  Filled 2015-08-27: qty 2

## 2015-08-27 NOTE — Evaluation (Signed)
Physical Therapy Evaluation Patient Details Name: Sheila Pugh MRN: [id number] DOB: Jul 04, 1935 Today's Date: 08/27/2015   History of Present Illness  Sheila Pugh is a 80 year old female with a history of hypertension, dyslipidemia, admitted to the medicine service on 08/26/2015 when she presented with complaints of generalized weakness, recurrent falls, failure to thrive  Clinical Impression  Pt admitted with above diagnosis. Pt currently with functional limitations due to the deficits listed below (see PT Problem List).  Pt will benefit from skilled PT to increase their independence and safety with mobility to allow discharge to the venue listed below.   Pt quite agreeable to therapy, movements slow, guarded, requiring min assist throughout for balance; pt reports she feels like she is "in a fog", pt reports she  had ambien last night and she normally does not take this at home; recommend HHPT, may need incr support at home initially depending on progress--has supportive family/sisters able to stay if needed; fall risk--will continue to follow     Follow Up Recommendations Home health PT;Supervision for mobility/OOB (may need initial supervision/assist depending on progress)    Equipment Recommendations    none   Recommendations for Other Services       Precautions / Restrictions Precautions Precautions: Fall Restrictions Weight Bearing Restrictions: No      Mobility  Bed Mobility Overal bed mobility: Needs Assistance Bed Mobility: Supine to Sit     Supine to sit: Min guard     General bed mobility comments: incr time, cues to self assist, close guarding for safety, near posterior LOB  multiple times with scooting  Transfers Overall transfer level: Needs assistance Equipment used: Rolling walker (2 wheeled);None Transfers: Sit to/from Stand Sit to Stand: Min assist         General transfer comment: assist to rise and stabilize, posterior  LOB  Ambulation/Gait Ambulation/Gait assistance: Min assist Ambulation Distance (Feet): 50 Feet Assistive device: Rolling walker (2 wheeled) Gait Pattern/deviations: Decreased stride length;Drifts right/left     General Gait Details: assist for balance and to maneuver RW occasionally; pt moves very slowly, guarded gait, denies overt dizziness  Stairs            Wheelchair Mobility    Modified Rankin (Stroke Patients Only)       Balance Overall balance assessment: Needs assistance Sitting-balance support: Feet supported;Bilateral upper extremity supported Sitting balance-Leahy Scale: Fair   Postural control: Posterior lean   Standing balance-Leahy Scale: Poor               High level balance activites: Turns;Head turns;Backward walking;Direction changes High Level Balance Comments: requires support to maintain balance             Pertinent Vitals/Pain Pain Assessment: No/denies pain    Home Living Family/patient expects to be discharged to:: Private residence Living Arrangements: Alone   Type of Home: House       Home Layout: One level Home Equipment: Cane - single point;Walker - standard (pt unsure of RW or SW, has access to w/c through church)      Prior Function Level of Independence: Independent         Comments: pt very active at baseline, mows her own yard, Health visitor Dominance        Extremity/Trunk Assessment   Upper Extremity Assessment: Overall WFL for tasks assessed           Lower Extremity Assessment: Overall WFL for tasks assessed  Communication   Communication: No difficulties  Cognition Arousal/Alertness: Awake/alert Behavior During Therapy: WFL for tasks assessed/performed Overall Cognitive Status: Within Functional Limits for tasks assessed                      General Comments      Exercises        Assessment/Plan    PT Assessment Patient needs continued PT services  PT  Diagnosis Difficulty walking   PT Problem List Decreased strength;Decreased range of motion;Decreased activity tolerance;Decreased mobility;Decreased balance;Decreased knowledge of use of DME  PT Treatment Interventions DME instruction;Gait training;Functional mobility training;Therapeutic activities;Therapeutic exercise;Patient/family education   PT Goals (Current goals can be found in the Care Plan section) Acute Rehab PT Goals Patient Stated Goal: back to normal PT Goal Formulation: With patient Time For Goal Achievement: 09/03/15 Potential to Achieve Goals: Good    Frequency Min 3X/week   Barriers to discharge        Co-evaluation               End of Session Equipment Utilized During Treatment: Gait belt Activity Tolerance: Patient tolerated treatment well Patient left: in chair;with call bell/phone within reach;with chair alarm set;with family/visitor present           Time: [id number] PT Time Calculation (min) (ACUTE ONLY): 29 min   Charges:   PT Evaluation $PT Eval Moderate Complexity: 1 Procedure PT Treatments $Gait Training: 8-22 mins   PT G Codes:        Sheila Pugh 2015/09/25, 10:46 AM

## 2015-08-27 NOTE — Progress Notes (Signed)
CRITICAL VALUE ALERT  Critical value received:  Na 118  Date of notification:  08/27/15  Time of notification:  0511  Critical value read back:yes  Nurse who received alert: Harlene Ramus  MD notified (1st page):  [place]  Time of first page:  0512  MD notified (2nd page):  Time of second page:  Responding MD:  Rachelle Hora  Time MD responded:  [phone]

## 2015-08-27 NOTE — Progress Notes (Signed)
PROGRESS NOTE    Sheila Pugh  [id number] DOB: May 24, 1935 DOA: 08/26/2015 PCP: Orpah Melter, MD  Outpatient Specialists:  Brief Narrative: Sheila Pugh is a 80 year old female with a history of hypertension, dyslipidemia, admitted to the medicine service on 08/26/2015 when Sheila Pugh presented with complaints of generalized weakness, recurrent falls, failure to thrive. Lab work revealed sodium of 15 with a potassium of 2.1. It was suspected suspected likely abnormality secondary to dehydration, minimal by mouth intake, hydrochlorothiazide. Sheila Pugh was given IV potassium replacement along with IV fluids.   Assessment & Plan:   Active Problems:   Generalized weakness   Hyponatremia   Hypokalemia   FTT (failure to thrive) in adult   Fall at home   1.  Failure to thrive/functional decline. -Sheila Pugh is a 80 year old residing in the community, presenting with a steep functional decline over the past several weeks characterized by recurrent falls, decreased activity, minimal by mouth intake -Suspect dehydration and vallecula abnormalities precipitating event. -Found to have sodium 115 and potassium of 2.1. -At the receiving IV potassium replacement and IV fluid Sheila Pugh reported feeling better on the following morning although not quite at Sheila Pugh baseline. -Physical therapy consulted, will continue IV fluid resuscitation as sodium remains low  2.  Hyponatremia. -Sheila Pugh presented with a sodium of 115 and appeared dry and physical examination. -Suspect this reflected hypotonic hypovolemic hyponatremia -Sodium improving to 118 this a.m. after the administration of IV fluids overnight -Plan to continue normal saline at 100 mL/hour  3.  Hypokalemia -Presented with a potassium of 2.1 -Likely secondary to combination of GI losses, hydrochlorothiazide, decreased by mouth intake -After receiving IV potassium replacement overnight, a.m. lab work showing improved potassium of 3.5 -Will give a dose of Kdur 40  meq PO x 1  4.  Hypertension -Continue metoprolol 100 mg by mouth twice a day and ramipril 10 mg by mouth daily -Hydrocodone thiazide stopped due to electrolyte abnormalities and dehydration -Follow blood pressures over the course of the day  5.  Recurrent falls -I secondary to electrolyte abnormalities, hypokalemia, hyponatremia, dehydration, functional decline -Physical therapy consulted   DVT prophylaxis: Lovenox Code Status:  Family Communication: Spoke to Sheila Pugh sister present at bedside Disposition Plan: Showing clinical improvement, sodium remains low, continue IV fluids. Pending physical therapy consultation  Subjective: Sheila Pugh states feeling better today, still little unsteady on Sheila Pugh feet. Has no other complaints  Objective: Filed Vitals:   08/26/15 1400 08/26/15 1512 08/26/15 1912 08/27/15 0630  BP: 177/75 174/69 140/81 153/63  Pulse: 95 97 94 81  Temp:  98.3 F (36.8 C) 98.2 F (36.8 C) 98.3 F (36.8 C)  TempSrc:  Oral Oral Oral  Resp: 21 18 18 18   Height:  5\' 1"  (1.549 m)    Weight:  55.1 kg (121 lb 7.6 oz)    SpO2: 99% 99% 99% 97%    Intake/Output Summary (Last 24 hours) at 08/27/15 0719 Last data filed at 08/27/15 0700  Gross per 24 hour  Intake 2557.09 ml  Output    700 ml  Net 1857.09 ml   Filed Weights   08/26/15 1512  Weight: 55.1 kg (121 lb 7.6 oz)    Examination:  General exam: Looks better overall, I watched Sheila Pugh ambulate in Sheila Pugh room, remains little unsteady and requires assistance Respiratory system: Clear to auscultation. Respiratory effort normal. Cardiovascular system: S1 & S2 heard, RRR. No JVD, murmurs, rubs, gallops or clicks. No pedal edema. Gastrointestinal system: Abdomen is nondistended, soft and nontender. No organomegaly or  masses felt. Normal bowel sounds heard. Central nervous system: Alert and oriented. No focal neurological deficits. Extremities: Symmetric 5 x 5 power. Skin: No rashes, lesions or ulcers Psychiatry: Judgement and  insight appear normal. Mood & affect appropriate.     Data Reviewed: I have personally reviewed following labs and imaging studies  CBC:  Recent Labs Lab 08/26/15 1044 08/27/15 0420  WBC 12.2* 12.2*  HGB 13.1 12.6  HCT 35.7* 34.7*  MCV 83.8 84.8  PLT 173 [id number]   Basic Metabolic Panel:  Recent Labs Lab 08/26/15 1044 08/27/15 0420  NA 115* 118*  K 2.1* 3.5  CL 75* 86*  CO2 25 24  GLUCOSE 152* 108*  BUN 25* 15  CREATININE 0.79 0.60  CALCIUM 8.4* 7.8*  MG 1.7  --    GFR: Estimated Creatinine Clearance: 44.4 mL/min (by C-G formula based on Cr of 0.6). Liver Function Tests: No results for input(s): AST, ALT, ALKPHOS, BILITOT, PROT, ALBUMIN in the last 168 hours. No results for input(s): LIPASE, AMYLASE in the last 168 hours. No results for input(s): AMMONIA in the last 168 hours. Coagulation Profile: No results for input(s): INR, PROTIME in the last 168 hours. Cardiac Enzymes: No results for input(s): CKTOTAL, CKMB, CKMBINDEX, TROPONINI in the last 168 hours. BNP (last 3 results) No results for input(s): PROBNP in the last 8760 hours. HbA1C: No results for input(s): HGBA1C in the last 72 hours. CBG:  Recent Labs Lab 08/26/15 1029  GLUCAP 158*   Lipid Profile: No results for input(s): CHOL, HDL, LDLCALC, TRIG, CHOLHDL, LDLDIRECT in the last 72 hours. Thyroid Function Tests: No results for input(s): TSH, T4TOTAL, FREET4, T3FREE, THYROIDAB in the last 72 hours. Anemia Panel: No results for input(s): VITAMINB12, FOLATE, FERRITIN, TIBC, IRON, RETICCTPCT in the last 72 hours. Urine analysis:    Component Value Date/Time   COLORURINE YELLOW 08/26/2015 2047   APPEARANCEUR CLOUDY* 08/26/2015 2047   LABSPEC 1.018 08/26/2015 2047   PHURINE 6.0 08/26/2015 2047   GLUCOSEU 250* 08/26/2015 2047   HGBUR TRACE* 08/26/2015 2047   BILIRUBINUR NEGATIVE 08/26/2015 2047   KETONESUR NEGATIVE 08/26/2015 2047   PROTEINUR NEGATIVE 08/26/2015 2047   UROBILINOGEN 0.2 09/07/2009  1216   NITRITE NEGATIVE 08/26/2015 2047   LEUKOCYTESUR MODERATE* 08/26/2015 2047   Sepsis Labs: @LABRCNTIP (procalcitonin:4,lacticidven:4)  )No results found for this or any previous visit (from the past 240 hour(s)).       Radiology Studies: Dg Chest 2 View  08/26/2015  CLINICAL DATA:  Leukocytosis EXAM: CHEST  2 VIEW COMPARISON:  09/07/2009 FINDINGS: The heart size and mediastinal contours are within normal limits. Both lungs are clear. The visualized skeletal structures are unremarkable. IMPRESSION: No active cardiopulmonary disease. Electronically Signed   By: Inez Catalina M.D.   On: 08/26/2015 12:22   Ct Head Wo Contrast  08/26/2015  CLINICAL DATA:  Fall. EXAM: CT HEAD WITHOUT CONTRAST TECHNIQUE: Contiguous axial images were obtained from the base of the skull through the vertex without intravenous contrast. COMPARISON:  CT head 08/22/2009 FINDINGS: Mild atrophy.  Negative for hydrocephalus Patchy hypodensity throughout the cerebral white matter and subcortical white matter is similar to the prior study. Findings most consistent with chronic microvascular ischemia. Negative for acute infarct.  Negative for hemorrhage or mass. Calvarium is normal. IMPRESSION: Extensive hypodensity throughout the cerebral white matter bilaterally stable since 2011. Findings most consistent with chronic microvascular ischemia. No acute abnormality Electronically Signed   By: Franchot Gallo M.D.   On: 08/26/2015 12:19  Scheduled Meds: . calcium carbonate  1 tablet Oral Daily  . cycloSPORINE  1 drop Both Eyes BID  . enoxaparin (LOVENOX) injection  40 mg Subcutaneous Q24H  . hydroxychloroquine  200 mg Oral Daily  . metoprolol tartrate  100 mg Oral BID  . nystatin  5 mL Oral TID PC & HS  . ramipril  10 mg Oral Daily  . sodium chloride flush  3 mL Intravenous Q12H   Continuous Infusions: . sodium chloride 100 mL/hr at 08/27/15 0652     LOS: 1 day    Time spent: 35 min    Kelvin Cellar, MD [facility] Pager [phone]  If 7PM-7AM, please contact night-coverage [email or web address] Password [facility] 08/27/2015, 7:19 AM

## 2015-08-28 LAB — BASIC METABOLIC PANEL
Anion gap: 9 (ref 5–15)
BUN: 12 mg/dL (ref 6–20)
CALCIUM: 7.5 mg/dL — AB (ref 8.9–10.3)
CO2: 22 mmol/L (ref 22–32)
CREATININE: 0.47 mg/dL (ref 0.44–1.00)
Chloride: 89 mmol/L — ABNORMAL LOW (ref 101–111)
GFR calc Af Amer: >60 mL/min (ref >60)
Glucose, Bld: 94 mg/dL (ref 65–99)
Potassium: 2.8 mmol/L — ABNORMAL LOW (ref 3.5–5.1)
Sodium: 120 mmol/L — ABNORMAL LOW (ref 135–145)

## 2015-08-28 LAB — CBC
HCT: 35.9 % — ABNORMAL LOW (ref 36.0–46.0)
Hemoglobin: 12.8 g/dL (ref 12.0–15.0)
MCH: 30.6 pg (ref 26.0–34.0)
MCHC: 35.7 g/dL (ref 30.0–36.0)
MCV: 85.9 fL (ref 78.0–100.0)
PLATELETS: 166 10*3/uL (ref 150–400)
RBC: 4.18 MIL/uL (ref 3.87–5.11)
RDW: 12.6 % (ref 11.5–15.5)
WBC: 11.8 10*3/uL — ABNORMAL HIGH (ref 4.0–10.5)

## 2015-08-28 MED ORDER — POTASSIUM CHLORIDE CRYS ER 20 MEQ PO TBCR
40.0000 meq | EXTENDED_RELEASE_TABLET | ORAL | Status: AC
  Administered 2015-08-28 (×3): 40 meq via ORAL
  Filled 2015-08-28 (×3): qty 2

## 2015-08-28 MED ORDER — ZOLPIDEM TARTRATE 5 MG PO TABS
5.0000 mg | ORAL_TABLET | Freq: Once | ORAL | Status: AC
  Administered 2015-08-29: 5 mg via ORAL
  Filled 2015-08-28 (×2): qty 1

## 2015-08-28 MED ORDER — SALINE SPRAY 0.65 % NA SOLN
1.0000 | NASAL | Status: DC | PRN
  Filled 2015-08-28: qty 44

## 2015-08-28 MED ORDER — AMLODIPINE BESYLATE 5 MG PO TABS
5.0000 mg | ORAL_TABLET | Freq: Every day | ORAL | Status: DC
  Administered 2015-08-28: 5 mg via ORAL
  Filled 2015-08-28: qty 1

## 2015-08-28 MED ORDER — POTASSIUM CHLORIDE CRYS ER 20 MEQ PO TBCR: 40.0000 meq | EXTENDED_RELEASE_TABLET | ORAL | Status: DC

## 2015-08-28 NOTE — Progress Notes (Signed)
PROGRESS NOTE    Sheila Pugh  [id number] DOB: 29-Nov-1935 DOA: 08/26/2015 PCP: Orpah Melter, MD  Outpatient Specialists:  Brief Narrative: Sheila Pugh is a 80 year old female with a history of hypertension, dyslipidemia, admitted to the medicine service on 08/26/2015 when she presented with complaints of generalized weakness, recurrent falls, failure to thrive. Lab work revealed sodium of 15 with a potassium of 2.1. It was suspected suspected likely abnormality secondary to dehydration, minimal by mouth intake, hydrochlorothiazide. She was given IV potassium replacement along with IV fluids.   Assessment & Plan:   Active Problems:   Generalized weakness   Hyponatremia   Hypokalemia   FTT (failure to thrive) in adult   Fall at home   1.  Failure to thrive/functional decline. -Sheila Windell is a 80 year old residing in the community, presenting with a steep functional decline over the past several weeks characterized by recurrent falls, decreased activity, minimal by mouth intake -Suspect dehydration and vallecula abnormalities precipitating event. -Found to have sodium 115 and potassium of 2.1. -At the receiving IV potassium replacement and IV fluid she reported feeling better on the following morning although not quite at her baseline. -Physical therapy consulted recommending home health physical therapy  2.  Hyponatremia. -She presented with a sodium of 115 and appeared dry and physical examination. -She had been on hydrochlorothiazide which I suspect may have contributed to her electrolyte abnormalities. Furthermore this was likely compounded by functional decline having minimal by mouth intake. -Sodium slowly trending up, at 120 on 08/28/2015. She still remains orthostatic, will continue IV fluid resuscitation  3.  Hypokalemia -Presented with a potassium of 2.1 -Likely secondary to combination of GI losses, hydrochlorothiazide, and decreased by mouth intake -After  receiving IV potassium replacement overnight, a.m. lab work showing improved potassium of 3.5 -On a.m. of 08/28/2015 labs show a potassium of 2.8. She will be given Kdur 40 mEq by mouth every 6 hours 3 doses today  4.  Hypertension -Continue metoprolol 100 mg by mouth twice a day and ramipril 10 mg by mouth daily -HCTZ stopped due to electrolyte abnormalities and dehydration -Her blood pressures remain elevated, will add Norvasc 5 mg by mouth daily.  5.  Recurrent falls -I secondary to electrolyte abnormalities, hypokalemia, hyponatremia, dehydration, functional decline -Physical therapy consulted recommending home health physical therapy   DVT prophylaxis: Lovenox Code Status:  Family Communication: Spoke to her sister present at bedside Disposition Plan: Sodium and potassium remains low, continue IV fluids, electrolyte replacement, I suspect she will be here through the weekend  Subjective: She states feeling a little dizzy when standing up  Objective: Filed Vitals:   08/28/15 0615 08/28/15 0725 08/28/15 0728 08/28/15 0925  BP: 153/50 183/71 136/66 176/71  Pulse:  82 89 81  Temp:      TempSrc:      Resp:      Height:      Weight:      SpO2:        Intake/Output Summary (Last 24 hours) at 08/28/15 1020 Last data filed at 08/28/15 0900  Gross per 24 hour  Intake   2780 ml  Output   1050 ml  Net   1730 ml   Filed Weights   08/26/15 1512  Weight: 55.1 kg (121 lb 7.6 oz)    Examination:  General exam: Nontoxic-appearing, states feeling little dizzy and lightheaded today, he was assisted out of bed to chair Respiratory system: Clear to auscultation. Respiratory effort normal. Cardiovascular system: S1 & S2  heard, RRR. No JVD, murmurs, rubs, gallops or clicks. No pedal edema. Gastrointestinal system: Abdomen is nondistended, soft and nontender. No organomegaly or masses felt. Normal bowel sounds heard. Central nervous system: Alert and oriented. No focal neurological  deficits. Extremities: Symmetric 5 x 5 power. Skin: No rashes, lesions or ulcers Psychiatry: Judgement and insight appear normal. Mood & affect appropriate.     Data Reviewed: I have personally reviewed following labs and imaging studies  CBC:  Recent Labs Lab 08/26/15 1044 08/27/15 0420 08/28/15 0417  WBC 12.2* 12.2* 11.8*  HGB 13.1 12.6 12.8  HCT 35.7* 34.7* 35.9*  MCV 83.8 84.8 85.9  PLT 173 171 [id number]   Basic Metabolic Panel:  Recent Labs Lab 08/26/15 1044 08/27/15 0420 08/28/15 0417  NA 115* 118* 120*  K 2.1* 3.5 2.8*  CL 75* 86* 89*  CO2 25 24 22   GLUCOSE 152* 108* 94  BUN 25* 15 12  CREATININE 0.79 0.60 0.47  CALCIUM 8.4* 7.8* 7.5*  MG 1.7  --   --    GFR: Estimated Creatinine Clearance: 44.4 mL/min (by C-G formula based on Cr of 0.47). Liver Function Tests: No results for input(s): AST, ALT, ALKPHOS, BILITOT, PROT, ALBUMIN in the last 168 hours. No results for input(s): LIPASE, AMYLASE in the last 168 hours. No results for input(s): AMMONIA in the last 168 hours. Coagulation Profile: No results for input(s): INR, PROTIME in the last 168 hours. Cardiac Enzymes: No results for input(s): CKTOTAL, CKMB, CKMBINDEX, TROPONINI in the last 168 hours. BNP (last 3 results) No results for input(s): PROBNP in the last 8760 hours. HbA1C: No results for input(s): HGBA1C in the last 72 hours. CBG:  Recent Labs Lab 08/26/15 1029  GLUCAP 158*   Lipid Profile: No results for input(s): CHOL, HDL, LDLCALC, TRIG, CHOLHDL, LDLDIRECT in the last 72 hours. Thyroid Function Tests: No results for input(s): TSH, T4TOTAL, FREET4, T3FREE, THYROIDAB in the last 72 hours. Anemia Panel: No results for input(s): VITAMINB12, FOLATE, FERRITIN, TIBC, IRON, RETICCTPCT in the last 72 hours. Urine analysis:    Component Value Date/Time   COLORURINE YELLOW 08/26/2015 2047   APPEARANCEUR CLOUDY* 08/26/2015 2047   LABSPEC 1.018 08/26/2015 2047   PHURINE 6.0 08/26/2015 2047    GLUCOSEU 250* 08/26/2015 2047   HGBUR TRACE* 08/26/2015 2047   BILIRUBINUR NEGATIVE 08/26/2015 2047   KETONESUR NEGATIVE 08/26/2015 2047   PROTEINUR NEGATIVE 08/26/2015 2047   UROBILINOGEN 0.2 09/07/2009 1216   NITRITE NEGATIVE 08/26/2015 2047   LEUKOCYTESUR MODERATE* 08/26/2015 2047   Sepsis Labs: @LABRCNTIP (procalcitonin:4,lacticidven:4)  )No results found for this or any previous visit (from the past 240 hour(s)).       Radiology Studies: Dg Chest 2 View  08/26/2015  CLINICAL DATA:  Leukocytosis EXAM: CHEST  2 VIEW COMPARISON:  09/07/2009 FINDINGS: The heart size and mediastinal contours are within normal limits. Both lungs are clear. The visualized skeletal structures are unremarkable. IMPRESSION: No active cardiopulmonary disease. Electronically Signed   By: Inez Catalina M.D.   On: 08/26/2015 12:22   Ct Head Wo Contrast  08/26/2015  CLINICAL DATA:  Fall. EXAM: CT HEAD WITHOUT CONTRAST TECHNIQUE: Contiguous axial images were obtained from the base of the skull through the vertex without intravenous contrast. COMPARISON:  CT head 08/22/2009 FINDINGS: Mild atrophy.  Negative for hydrocephalus Patchy hypodensity throughout the cerebral white matter and subcortical white matter is similar to the prior study. Findings most consistent with chronic microvascular ischemia. Negative for acute infarct.  Negative for hemorrhage or mass. Calvarium  is normal. IMPRESSION: Extensive hypodensity throughout the cerebral white matter bilaterally stable since 2011. Findings most consistent with chronic microvascular ischemia. No acute abnormality Electronically Signed   By: Franchot Gallo M.D.   On: 08/26/2015 12:19        Scheduled Meds: . calcium carbonate  1 tablet Oral Daily  . cycloSPORINE  1 drop Both Eyes BID  . enoxaparin (LOVENOX) injection  40 mg Subcutaneous Q24H  . hydroxychloroquine  200 mg Oral Daily  . metoprolol tartrate  100 mg Oral BID  . nystatin  5 mL Oral TID PC & HS  .  potassium chloride  40 mEq Oral Q4H  . ramipril  10 mg Oral Daily  . sodium chloride flush  3 mL Intravenous Q12H   Continuous Infusions: . sodium chloride 100 mL/hr at 08/28/15 0430     LOS: 2 days    Time spent: 35 min    Kelvin Cellar, MD [facility] Pager [phone]  If 7PM-7AM, please contact night-coverage [email or web address] Password [email or web address] 08/28/2015, 10:20 AM

## 2015-08-29 LAB — BASIC METABOLIC PANEL
ANION GAP: 9 (ref 5–15)
BUN: 7 mg/dL (ref 6–20)
CHLORIDE: 92 mmol/L — AB (ref 101–111)
CO2: 22 mmol/L (ref 22–32)
Calcium: 7.8 mg/dL — ABNORMAL LOW (ref 8.9–10.3)
Creatinine, Ser: 0.49 mg/dL (ref 0.44–1.00)
GFR calc Af Amer: >60 mL/min (ref >60)
Glucose, Bld: 129 mg/dL — ABNORMAL HIGH (ref 65–99)
POTASSIUM: 3.4 mmol/L — AB (ref 3.5–5.1)
SODIUM: 123 mmol/L — AB (ref 135–145)

## 2015-08-29 MED ORDER — POTASSIUM CHLORIDE CRYS ER 20 MEQ PO TBCR
40.0000 meq | EXTENDED_RELEASE_TABLET | Freq: Four times a day (QID) | ORAL | Status: AC
  Administered 2015-08-29 (×2): 40 meq via ORAL
  Filled 2015-08-29 (×3): qty 2

## 2015-08-29 MED ORDER — AMLODIPINE BESYLATE 10 MG PO TABS
10.0000 mg | ORAL_TABLET | Freq: Every day | ORAL | Status: DC
  Administered 2015-08-29 – 2015-08-30 (×2): 10 mg via ORAL
  Filled 2015-08-29 (×2): qty 1

## 2015-08-29 NOTE — Progress Notes (Signed)
PROGRESS NOTE    Sheila Pugh  [id number] DOB: August 27, 1935 DOA: 08/26/2015 PCP: Orpah Melter, MD  Outpatient Specialists:  Brief Narrative: Sheila Pugh is a 80 year old female with a history of hypertension, dyslipidemia, admitted to the medicine service on 08/26/2015 when she presented with complaints of generalized weakness, recurrent falls, failure to thrive. Lab work revealed sodium of 15 with a potassium of 2.1. It was suspected suspected likely abnormality secondary to dehydration, minimal by mouth intake, hydrochlorothiazide. She was given IV potassium replacement along with IV fluids.   Assessment & Plan:   Active Problems:   Generalized weakness   Hyponatremia   Hypokalemia   FTT (failure to thrive) in adult   Fall at home   1.  Failure to thrive/functional decline. -Sheila Huyck is a 80 year old residing in the community, presenting with a steep functional decline over the past several weeks characterized by recurrent falls, decreased activity, minimal by mouth intake -Suspect dehydration and vallecula abnormalities precipitating event. -Found to have sodium 115 and potassium of 2.1.e. -Physical therapy consulted recommending home health physical therapy -On 08/29/2015 patient showing improvement, she was ambulated to the nurses station and back with her walker  2.  Hyponatremia. -She presented with a sodium of 115 and appeared dry and physical examination. -She had been on hydrochlorothiazide which I suspect may have contributed to her electrolyte abnormalities. Furthermore this was likely compounded by functional decline having minimal by mouth intake. -Sodium level slowly trending up, improved to 123 on 08/29/2015 from 115 on admission.  3.  Hypokalemia -Presented with a potassium of 2.1 -Likely secondary to combination of GI losses, hydrochlorothiazide, and decreased by mouth intake -On 08/29/2015: Labs showing improved potassium of 3.4, on admission had a  potassium of 2.1. During this hospitalization she has received IV and by mouth replacement. Will give Kdur 59meq PO x 2 doses today  4.  Hypertension -Continue metoprolol 100 mg by mouth twice a day and ramipril 10 mg by mouth daily -HCTZ stopped due to electrolyte abnormalities and dehydration -Blood pressures remain elevated, she was started on 5 mg of Norvasc on 08/28/2015, will increase Norvasc dose to 10 mg by mouth daily  5.  Recurrent falls -I secondary to electrolyte abnormalities, hypokalemia, hyponatremia, dehydration, functional decline -Physical therapy consulted recommending home health physical therapy   DVT prophylaxis: Lovenox Code Status:  Family Communication: Spoke to her sister present at bedside Disposition Plan: Sodium and potassium remains low, continue IV fluids, electrolyte replacement, I suspect she will be here through the weekend   Subjective: She states feeling better today, she ambulated to the nurses station and back, doing well.  Objective: Filed Vitals:   08/28/15 0925 08/28/15 1446 08/28/15 2114 08/29/15 0549  BP: 176/71 169/70 166/69 172/77  Pulse: 81 80 86 80  Temp:  98.5 F (36.9 C) 98.3 F (36.8 C) 98.3 F (36.8 C)  TempSrc:  Oral Oral Oral  Resp:  18 20 18   Height:      Weight:      SpO2:  100% 98% 99%    Intake/Output Summary (Last 24 hours) at 08/29/15 0917 Last data filed at 08/29/15 [id number]  Gross per 24 hour  Intake 2686.67 ml  Output      0 ml  Net 2686.67 ml   Filed Weights   08/26/15 1512  Weight: 55.1 kg (121 lb 7.6 oz)    Examination:  General exam: Overall looks better, we got her out of bed and ambulated her down the hallway Respiratory  system: Clear to auscultation. Respiratory effort normal. Cardiovascular system: S1 & S2 heard, RRR. No JVD, murmurs, rubs, gallops or clicks. No pedal edema. Gastrointestinal system: Abdomen is nondistended, soft and nontender. No organomegaly or masses felt. Normal bowel sounds  heard. Central nervous system: Alert and oriented. No focal neurological deficits. Extremities: Symmetric 5 x 5 power. Skin: No rashes, lesions or ulcers Psychiatry: Judgement and insight appear normal. Mood & affect appropriate.     Data Reviewed: I have personally reviewed following labs and imaging studies  CBC:  Recent Labs Lab 08/26/15 1044 08/27/15 0420 08/28/15 0417  WBC 12.2* 12.2* 11.8*  HGB 13.1 12.6 12.8  HCT 35.7* 34.7* 35.9*  MCV 83.8 84.8 85.9  PLT 173 171 [id number]   Basic Metabolic Panel:  Recent Labs Lab 08/26/15 1044 08/27/15 0420 08/28/15 0417 08/29/15 0740  NA 115* 118* 120* 123*  K 2.1* 3.5 2.8* 3.4*  CL 75* 86* 89* 92*  CO2 25 24 22 22   GLUCOSE 152* 108* 94 129*  BUN 25* 15 12 7   CREATININE 0.79 0.60 0.47 0.49  CALCIUM 8.4* 7.8* 7.5* 7.8*  MG 1.7  --   --   --    GFR: Estimated Creatinine Clearance: 44.4 mL/min (by C-G formula based on Cr of 0.49). Liver Function Tests: No results for input(s): AST, ALT, ALKPHOS, BILITOT, PROT, ALBUMIN in the last 168 hours. No results for input(s): LIPASE, AMYLASE in the last 168 hours. No results for input(s): AMMONIA in the last 168 hours. Coagulation Profile: No results for input(s): INR, PROTIME in the last 168 hours. Cardiac Enzymes: No results for input(s): CKTOTAL, CKMB, CKMBINDEX, TROPONINI in the last 168 hours. BNP (last 3 results) No results for input(s): PROBNP in the last 8760 hours. HbA1C: No results for input(s): HGBA1C in the last 72 hours. CBG:  Recent Labs Lab 08/26/15 1029  GLUCAP 158*   Lipid Profile: No results for input(s): CHOL, HDL, LDLCALC, TRIG, CHOLHDL, LDLDIRECT in the last 72 hours. Thyroid Function Tests: No results for input(s): TSH, T4TOTAL, FREET4, T3FREE, THYROIDAB in the last 72 hours. Anemia Panel: No results for input(s): VITAMINB12, FOLATE, FERRITIN, TIBC, IRON, RETICCTPCT in the last 72 hours. Urine analysis:    Component Value Date/Time   COLORURINE YELLOW  08/26/2015 2047   APPEARANCEUR CLOUDY* 08/26/2015 2047   LABSPEC 1.018 08/26/2015 2047   PHURINE 6.0 08/26/2015 2047   GLUCOSEU 250* 08/26/2015 2047   HGBUR TRACE* 08/26/2015 2047   BILIRUBINUR NEGATIVE 08/26/2015 2047   KETONESUR NEGATIVE 08/26/2015 2047   PROTEINUR NEGATIVE 08/26/2015 2047   UROBILINOGEN 0.2 09/07/2009 1216   NITRITE NEGATIVE 08/26/2015 2047   LEUKOCYTESUR MODERATE* 08/26/2015 2047   Sepsis Labs: @LABRCNTIP (procalcitonin:4,lacticidven:4)  )No results found for this or any previous visit (from the past 240 hour(s)).       Radiology Studies: No results found.      Scheduled Meds: . amLODipine  10 mg Oral Daily  . calcium carbonate  1 tablet Oral Daily  . cycloSPORINE  1 drop Both Eyes BID  . enoxaparin (LOVENOX) injection  40 mg Subcutaneous Q24H  . hydroxychloroquine  200 mg Oral Daily  . metoprolol tartrate  100 mg Oral BID  . nystatin  5 mL Oral TID PC & HS  . potassium chloride  40 mEq Oral Q6H  . ramipril  10 mg Oral Daily  . sodium chloride flush  3 mL Intravenous Q12H  . zolpidem  5 mg Oral Once   Continuous Infusions: . sodium chloride 100 mL/hr  at 08/28/15 2330     LOS: 3 days    Time spent: 25 min    Kelvin Cellar, MD [facility] Pager [phone]  If 7PM-7AM, please contact night-coverage [email or web address] Password [email or web address] 08/29/2015, 9:17 AM

## 2015-08-30 LAB — BASIC METABOLIC PANEL
Anion gap: 9 (ref 5–15)
BUN: 7 mg/dL (ref 6–20)
CALCIUM: 8.1 mg/dL — AB (ref 8.9–10.3)
CO2: 24 mmol/L (ref 22–32)
CREATININE: 0.48 mg/dL (ref 0.44–1.00)
Chloride: 91 mmol/L — ABNORMAL LOW (ref 101–111)
GFR calc Af Amer: >60 mL/min (ref >60)
GLUCOSE: 108 mg/dL — AB (ref 65–99)
POTASSIUM: 4 mmol/L (ref 3.5–5.1)
SODIUM: 124 mmol/L — AB (ref 135–145)

## 2015-08-30 LAB — CBC
HCT: 35.5 % — ABNORMAL LOW (ref 36.0–46.0)
Hemoglobin: 12.5 g/dL (ref 12.0–15.0)
MCH: 30.3 pg (ref 26.0–34.0)
MCHC: 35.2 g/dL (ref 30.0–36.0)
MCV: 86.2 fL (ref 78.0–100.0)
PLATELETS: 214 10*3/uL (ref 150–400)
RBC: 4.12 MIL/uL (ref 3.87–5.11)
RDW: 12.8 % (ref 11.5–15.5)
WBC: 10.9 10*3/uL — AB (ref 4.0–10.5)

## 2015-08-30 MED ORDER — ISOSORBIDE MONONITRATE ER 30 MG PO TB24
30.0000 mg | ORAL_TABLET | Freq: Every day | ORAL | Status: DC
  Administered 2015-08-30: 30 mg via ORAL
  Filled 2015-08-30: qty 1

## 2015-08-30 MED ORDER — SODIUM CHLORIDE 1 G PO TABS
1.0000 g | ORAL_TABLET | Freq: Two times a day (BID) | ORAL | Status: AC
  Administered 2015-08-30 (×2): 1 g via ORAL
  Filled 2015-08-30 (×2): qty 1

## 2015-08-30 MED ORDER — ISOSORBIDE MONONITRATE ER 30 MG PO TB24: 15.0000 mg | ORAL_TABLET | Freq: Every day | ORAL | Status: DC

## 2015-08-30 MED ORDER — SODIUM CHLORIDE 0.9 % IV BOLUS (SEPSIS)
500.0000 mL | Freq: Once | INTRAVENOUS | Status: AC
  Administered 2015-08-30: 500 mL via INTRAVENOUS

## 2015-08-30 NOTE — Progress Notes (Signed)
PT Cancellation Note  Patient Details Name: Sheila Pugh MRN: [id number] DOB: 1935-11-24   Cancelled Treatment:    Reason Eval/Treat Not Completed: Fatigue/lethargy limiting ability to participate (pt declines PT d/t amb with MD earlier today)   [facility] 08/30/2015, 12:54 PM

## 2015-08-30 NOTE — Progress Notes (Signed)
PROGRESS NOTE    Sheila Pugh  [id number] DOB: 02/08/1936 DOA: 08/26/2015 PCP: Orpah Melter, MD  Outpatient Specialists:  Brief Narrative: Sheila Pugh is a 80 year old female with a history of hypertension, dyslipidemia, admitted to the medicine service on 08/26/2015 when she presented with complaints of generalized weakness, recurrent falls, failure to thrive. Lab work revealed sodium of 15 with a potassium of 2.1. It was suspected suspected likely abnormality secondary to dehydration, minimal by mouth intake, hydrochlorothiazide. She was given IV potassium replacement along with IV fluids.   Assessment & Plan:   Active Problems:   Generalized weakness   Hyponatremia   Hypokalemia   FTT (failure to thrive) in adult   Fall at home   1.  Failure to thrive/functional decline. -Sheila Pugh is a 80 year old residing in the community, presenting with a steep functional decline over the past several weeks characterized by recurrent falls, decreased activity, minimal by mouth intake -Suspect dehydration and vallecula abnormalities precipitating event. -Found to have sodium 115 and potassium of 2.1.e. -Physical therapy consulted recommending home health physical therapy -On 08/29/2015 patient showing improvement, she was ambulated to the nurses station and back with her walker  2.  Hyponatremia. -She presented with a sodium of 115 and appeared dry and physical examination. -She had been on hydrochlorothiazide which I suspect may have contributed to her electrolyte abnormalities. Furthermore this was likely compounded by functional decline having minimal by mouth intake. -Sodium level slowly trending up, on 08/30/2015 having sodium of 124. There is been gradual improvement in her sodium. Provide NaCl tabs x 2 today. Repeat labs in a.m.  3.  Hypokalemia -Presented with a potassium of 2.1 -Likely secondary to combination of GI losses, hydrochlorothiazide, and decreased by mouth  intake -On 08/29/2015: Labs showing improved potassium of 3.4, on admission had a potassium of 2.1. During this hospitalization she has received IV and by mouth replacement.  -Lab work on 08/30/2015 showing resolution to hypokalemia with potassium of 4.0  4.  Hypertension -Continue metoprolol 100 mg by mouth twice a day and ramipril 10 mg by mouth daily -HCTZ stopped due to electrolyte abnormalities and dehydration -Blood pressures remain elevated, she was started on 5 mg of Norvasc on 08/28/2015, will increase Norvasc dose to 10 mg by mouth daily -Despite having Norvasc 10 mg by mouth daily on board blood pressures have remained elevated with systolic blood pressures in the 160s 170s. I switched her to Imdur 30 mg PO q daily  5.  Recurrent falls -I secondary to electrolyte abnormalities, hypokalemia, hyponatremia, dehydration, functional decline -Physical therapy consulted recommending home health physical therapy   DVT prophylaxis: Lovenox Code Status: Full code Family Communication: Spoke to her sister present at bedside Disposition Plan: Overall she shown improvement however sodium remains low at 124 which is a barrier to discharge  Subjective: We ambulated down the hallway to the nurses station and back, overall I think she did well with her walker  Objective: Filed Vitals:   08/29/15 2122 08/30/15 0441 08/30/15 0727 08/30/15 1043  BP: 163/73 173/75 179/77 113/52  Pulse: 92 82 84   Temp: 98.2 F (36.8 C) 99.3 F (37.4 C)    TempSrc: Oral Oral    Resp: 18 18    Height:      Weight:      SpO2: 98% 99%      Intake/Output Summary (Last 24 hours) at 08/30/15 1204 Last data filed at 08/30/15 0804  Gross per 24 hour  Intake 2027.5 ml  Output      0 ml  Net 2027.5 ml   Filed Weights   08/26/15 1512  Weight: 55.1 kg (121 lb 7.6 oz)    Examination:  General exam: Overall looks better, we got her out of bed and ambulated her down the hallway Respiratory system: Clear to  auscultation. Respiratory effort normal. Cardiovascular system: S1 & S2 heard, RRR. No JVD, murmurs, rubs, gallops or clicks. No pedal edema. Gastrointestinal system: Abdomen is nondistended, soft and nontender. No organomegaly or masses felt. Normal bowel sounds heard. Central nervous system: Alert and oriented. No focal neurological deficits. Extremities: Symmetric 5 x 5 power. Skin: No rashes, lesions or ulcers Psychiatry: Judgement and insight appear normal. Mood & affect appropriate.     Data Reviewed: I have personally reviewed following labs and imaging studies  CBC:  Recent Labs Lab 08/26/15 1044 08/27/15 0420 08/28/15 0417 08/30/15 0441  WBC 12.2* 12.2* 11.8* 10.9*  HGB 13.1 12.6 12.8 12.5  HCT 35.7* 34.7* 35.9* 35.5*  MCV 83.8 84.8 85.9 86.2  PLT 173 171 166 [id number]   Basic Metabolic Panel:  Recent Labs Lab 08/26/15 1044 08/27/15 0420 08/28/15 0417 08/29/15 0740 08/30/15 0441  NA 115* 118* 120* 123* 124*  K 2.1* 3.5 2.8* 3.4* 4.0  CL 75* 86* 89* 92* 91*  CO2 25 24 22 22 24   GLUCOSE 152* 108* 94 129* 108*  BUN 25* 15 12 7 7   CREATININE 0.79 0.60 0.47 0.49 0.48  CALCIUM 8.4* 7.8* 7.5* 7.8* 8.1*  MG 1.7  --   --   --   --    GFR: Estimated Creatinine Clearance: 44.4 mL/min (by C-G formula based on Cr of 0.48). Liver Function Tests: No results for input(s): AST, ALT, ALKPHOS, BILITOT, PROT, ALBUMIN in the last 168 hours. No results for input(s): LIPASE, AMYLASE in the last 168 hours. No results for input(s): AMMONIA in the last 168 hours. Coagulation Profile: No results for input(s): INR, PROTIME in the last 168 hours. Cardiac Enzymes: No results for input(s): CKTOTAL, CKMB, CKMBINDEX, TROPONINI in the last 168 hours. BNP (last 3 results) No results for input(s): PROBNP in the last 8760 hours. HbA1C: No results for input(s): HGBA1C in the last 72 hours. CBG:  Recent Labs Lab 08/26/15 1029  GLUCAP 158*   Lipid Profile: No results for input(s): CHOL,  HDL, LDLCALC, TRIG, CHOLHDL, LDLDIRECT in the last 72 hours. Thyroid Function Tests: No results for input(s): TSH, T4TOTAL, FREET4, T3FREE, THYROIDAB in the last 72 hours. Anemia Panel: No results for input(s): VITAMINB12, FOLATE, FERRITIN, TIBC, IRON, RETICCTPCT in the last 72 hours. Urine analysis:    Component Value Date/Time   COLORURINE YELLOW 08/26/2015 2047   APPEARANCEUR CLOUDY* 08/26/2015 2047   LABSPEC 1.018 08/26/2015 2047   PHURINE 6.0 08/26/2015 2047   GLUCOSEU 250* 08/26/2015 2047   HGBUR TRACE* 08/26/2015 2047   BILIRUBINUR NEGATIVE 08/26/2015 2047   KETONESUR NEGATIVE 08/26/2015 2047   PROTEINUR NEGATIVE 08/26/2015 2047   UROBILINOGEN 0.2 09/07/2009 1216   NITRITE NEGATIVE 08/26/2015 2047   LEUKOCYTESUR MODERATE* 08/26/2015 2047   Sepsis Labs: @LABRCNTIP (procalcitonin:4,lacticidven:4)  )No results found for this or any previous visit (from the past 240 hour(s)).       Radiology Studies: No results found.      Scheduled Meds: . calcium carbonate  1 tablet Oral Daily  . cycloSPORINE  1 drop Both Eyes BID  . enoxaparin (LOVENOX) injection  40 mg Subcutaneous Q24H  . hydroxychloroquine  200 mg Oral Daily  .  isosorbide mononitrate  30 mg Oral Daily  . metoprolol tartrate  100 mg Oral BID  . nystatin  5 mL Oral TID PC & HS  . ramipril  10 mg Oral Daily  . sodium chloride flush  3 mL Intravenous Q12H  . sodium chloride  1 g Oral BID WC   Continuous Infusions: . sodium chloride 75 mL/hr at 08/30/15 1121     LOS: 4 days    Time spent: 25 min    Kelvin Cellar, MD [facility] Pager [phone]  If 7PM-7AM, please contact night-coverage [email or web address] Password [email or web address] 08/30/2015, 12:04 PM

## 2015-08-30 NOTE — Care Management Important Message (Signed)
Important Message  Patient Details  Name: Sheila Pugh MRN: [id number] Date of Birth: [birth year]   Medicare Important Message Given:  Yes    Camillo Flaming 08/30/2015, 9:40 AMImportant Message  Patient Details  Name: Sheila Pugh MRN: [id number] Date of Birth: 08/13/1935   Medicare Important Message Given:  Yes    Camillo Flaming 08/30/2015, 9:40 AM

## 2015-08-31 LAB — BASIC METABOLIC PANEL
Anion gap: 8 (ref 5–15)
BUN: 8 mg/dL (ref 6–20)
CALCIUM: 8 mg/dL — AB (ref 8.9–10.3)
CHLORIDE: 93 mmol/L — AB (ref 101–111)
CO2: 26 mmol/L (ref 22–32)
CREATININE: 0.47 mg/dL (ref 0.44–1.00)
GFR calc non Af Amer: >60 mL/min (ref >60)
GLUCOSE: 99 mg/dL (ref 65–99)
Potassium: 3 mmol/L — ABNORMAL LOW (ref 3.5–5.1)
Sodium: 127 mmol/L — ABNORMAL LOW (ref 135–145)

## 2015-08-31 LAB — CBC
HCT: 33.4 % — ABNORMAL LOW (ref 36.0–46.0)
Hemoglobin: 12 g/dL (ref 12.0–15.0)
MCH: 30.9 pg (ref 26.0–34.0)
MCHC: 35.9 g/dL (ref 30.0–36.0)
MCV: 86.1 fL (ref 78.0–100.0)
PLATELETS: 239 10*3/uL (ref 150–400)
RBC: 3.88 MIL/uL (ref 3.87–5.11)
RDW: 13 % (ref 11.5–15.5)
WBC: 11.3 10*3/uL — ABNORMAL HIGH (ref 4.0–10.5)

## 2015-08-31 MED ORDER — BISACODYL 10 MG RE SUPP
10.0000 mg | Freq: Every day | RECTAL | Status: DC | PRN
  Administered 2015-09-01: 10 mg via RECTAL
  Filled 2015-08-31: qty 1

## 2015-08-31 MED ORDER — POTASSIUM CHLORIDE CRYS ER 20 MEQ PO TBCR
40.0000 meq | EXTENDED_RELEASE_TABLET | Freq: Four times a day (QID) | ORAL | Status: AC
  Administered 2015-08-31 (×2): 40 meq via ORAL
  Filled 2015-08-31 (×2): qty 2

## 2015-08-31 MED ORDER — POLYETHYLENE GLYCOL 3350 17 G PO PACK
17.0000 g | PACK | Freq: Every day | ORAL | Status: DC
  Administered 2015-08-31 – 2015-09-01 (×2): 17 g via ORAL
  Filled 2015-08-31 (×2): qty 1

## 2015-08-31 MED ORDER — DOCUSATE SODIUM 100 MG PO CAPS
100.0000 mg | ORAL_CAPSULE | Freq: Two times a day (BID) | ORAL | Status: DC
  Administered 2015-08-31 – 2015-09-01 (×3): 100 mg via ORAL
  Filled 2015-08-31 (×3): qty 1

## 2015-08-31 MED ORDER — ZOLPIDEM TARTRATE 5 MG PO TABS
5.0000 mg | ORAL_TABLET | Freq: Every evening | ORAL | Status: DC | PRN
  Administered 2015-08-31 (×2): 5 mg via ORAL
  Filled 2015-08-31 (×2): qty 1

## 2015-08-31 NOTE — Clinical Social Work Note (Signed)
Clinical Social Work Assessment  Patient Details  Name: Sheila Pugh MRN: [id number] Date of Birth: Oct 13, 1935  Date of referral:  08/31/15               Reason for consult:  Facility Placement                Permission sought to share information with:  Chartered certified accountant granted to share information::  Yes, Verbal Permission Granted  Name::        Agency::     Relationship::     Contact Information:     Housing/Transportation Living arrangements for the past 2 months:  [place] of Information:  Patient, Adult Children (sister, Vaughan Basta via phone) Patient Interpreter Needed:  None Criminal Activity/Legal Involvement Pertinent to Current Situation/Hospitalization:  No - Comment as needed Significant Relationships:  Adult Children, Siblings Lives with:  Self Do you feel safe going back to the place where you live?  No Need for family participation in patient care:  Yes (Comment)  Care giving concerns:  CSW received consult from [place] that patient & sister are requesting SNF placement at discharge.    Social Worker assessment / plan:  CSW met with patient, daughter Sharyn Lull at bedside & sister Vaughan Basta via phone re: discharge planning.   Employment status:  Retired Nurse, adult PT Recommendations:  [place] / Referral to community resources:  [place]  Patient/Family's Response to care:  CSW provided SNF bed offers. Patient had requested [facility] due to proximity to their home but unfortunately they are out-of-network with her insurance. Patient accepted bed at [facility] made aware.   Patient/Family's Understanding of and Emotional Response to Diagnosis, Current Treatment, and Prognosis:  Patient is happy to be talking about discharge and the road to recovery.   Emotional Assessment Appearance:  Appears stated  age Attitude/Demeanor/Rapport:    Affect (typically observed):  Accepting Orientation:  Oriented to Self, Oriented to Place, Oriented to  Time, Oriented to Situation Alcohol / Substance use:    Psych involvement (Current and /or in the community):     Discharge Needs  Concerns to be addressed:    Readmission within the last 30 days:    Current discharge risk:    Barriers to Discharge:      Standley Brooking, LCSW 08/31/2015, 2:08 PM

## 2015-08-31 NOTE — Progress Notes (Signed)
Physical Therapy Treatment Patient Details Name: Sheila Pugh MRN: [id number] DOB: 1935/05/08 Today's Date: 08/31/2015    History of Present Illness Sheila Pugh is a 80 year old female with a history of hypertension, dyslipidemia, admitted to the medicine service on 08/26/2015 when she presented with complaints of generalized weakness, recurrent falls, failure to thrive    PT Comments    Pt not progressing as well with c/o weakness/fatigue and dizziness with each position change.  Very unsteady gait with delayed corrective reaction.  X 1 posterior LOB with turn in hallway.  HIGH FALL RISK. Pt will need ST Rehab at SNF prior to returning home.  Will update LPT and consult.  Follow Up Recommendations  SNF      Equipment Recommendations       Recommendations for Other Services       Precautions / Restrictions Precautions Precautions: Fall Restrictions Weight Bearing Restrictions: No    Mobility  Bed Mobility Overal bed mobility: Needs Assistance Bed Mobility: Supine to Sit;Sit to Supine     Supine to sit: Min guard Sit to supine: Min assist   General bed mobility comments: increased time due to mild c/o dizziness and extra assist back to bed with B LE's due to fatigue/weakness  Transfers Overall transfer level: Needs assistance Equipment used: None;Rolling walker (2 wheeled) Transfers: Sit to/from [facility] Sit to Stand: Min guard;Min assist Stand pivot transfers: Min guard;Min assist       General transfer comment: assist to rise and stabilize, posterior LOB    25% VC's  safety with turns and hand placement to steady self.  Ambulation/Gait Ambulation/Gait assistance: Min assist Ambulation Distance (Feet): 28 Feet Assistive device: Rolling walker (2 wheeled) Gait Pattern/deviations: Step-through pattern;Decreased stride length;Trunk flexed;Drifts right/left Gait velocity: decreased   General Gait Details: decreased amb distance due to max c/o  weakness/fatigue.  Mild c/o dizziness.  Slow.  Delayed corredctive reaction with posterior LOB x 1 with turns.  HIGH FALL RISK.  Amb a limited distance without RW, pt required Mos Assist to prevent LOB.     Stairs            Wheelchair Mobility    Modified Rankin (Stroke Patients Only)       Balance                                    Cognition Arousal/Alertness: Awake/alert Behavior During Therapy: WFL for tasks assessed/performed Overall Cognitive Status: Within Functional Limits for tasks assessed                      Exercises      General Comments        Pertinent Vitals/Pain Pain Assessment: No/denies pain    Home Living                      Prior Function            PT Goals (current goals can now be found in the care plan section) Progress towards PT goals: Progressing toward goals    Frequency       PT Plan Current plan remains appropriate    Co-evaluation             End of Session Equipment Utilized During Treatment: Gait belt Activity Tolerance: Patient limited by fatigue Patient left: in bed;with bed alarm set;with call bell/phone within reach  Time: [id number] PT Time Calculation (min) (ACUTE ONLY): 13 min  Charges:  $Gait Training: 8-22 mins                    G Codes:      Rica Koyanagi  PTA [facility]  Acute  Rehab Pager      [phone]

## 2015-08-31 NOTE — Progress Notes (Signed)
PROGRESS NOTE    Sheila Pugh  [id number] DOB: 1936-01-28 DOA: 08/26/2015 PCP: Orpah Melter, MD  Outpatient Specialists:  Brief Narrative: [name] is a 80 year old female with a history of hypertension, dyslipidemia, admitted to the medicine service on 08/26/2015 when she presented with complaints of generalized weakness, recurrent falls, failure to thrive. Lab work revealed sodium of 15 with a potassium of 2.1. It was suspected suspected likely abnormality secondary to dehydration, minimal by mouth intake, hydrochlorothiazide. She was given IV potassium replacement along with IV fluids.   Assessment & Plan:   Active Problems:   Generalized weakness   Hyponatremia   Hypokalemia   FTT (failure to thrive) in adult   Fall at home   1.  Failure to thrive/functional decline. -[name] is a 80 year old residing in the community, presenting with a steep functional decline over the past several weeks characterized by recurrent falls, decreased activity, minimal by mouth intake -Suspect dehydration and vallecula abnormalities precipitating event. -Found to have sodium 115 and potassium of 2.1. -Physical therapy consulted recommending home health physical therapy. Home health face-to-face completed on Epic  2.  Hyponatremia. -She presented with a sodium of 115 and appeared dry and physical examination. -She had been on hydrochlorothiazide which I suspect may have contributed to her electrolyte abnormalities. Furthermore this was likely compounded by functional decline having minimal by mouth intake. -Her sodium level slowly trending up, a.m. lab work on 08/31/2015 showing improving sodium of 127. 127<124<123<120<118<115.  -Plan to continue NS at 75 mL/hour   3.  Hypokalemia -Presented with a potassium of 2.1 -Likely secondary to combination of GI losses, hydrochlorothiazide, and decreased by mouth intake - During this hospitalization she has received IV and by mouth  replacement.  -Lab work on 08/31/2015 showing potassium of 3.0, will replace with Kdur 40 meq PO x 2   4.  Hypertension -She is metoprolol 100 mg by mouth twice a day and ramipril 10 mg by mouth daily -HCTZ stopped due to electrolyte abnormalities and dehydration -She has had elevated blood pressures during this hospitalization. On 08/30/2015 blood pressures fell to 107/47 after giving Imdur, and this medication was subsequently stopped. -Will monitor blood pressures over the course of the day.  5.  Recurrent falls -I secondary to electrolyte abnormalities, hypokalemia, hyponatremia, dehydration, functional decline -Physical therapy consulted recommending home health physical therapy   DVT prophylaxis: Lovenox Code Status: Full code Family Communication: Spoke to her sister present at bedside Disposition Plan: Showing clinical improvement with sodium at 127. Plan to continue one more day of IV fluids, anticipate discharge home with home health services in the next 24 hours  Subjective: She states feeling better, ambulated down the hallway to the nurse's station this morning. Reports eating better.  Objective: Filed Vitals:   08/30/15 1043 08/30/15 1339 08/30/15 2019 08/31/15 0433  BP: 113/52 107/47 131/56 160/69  Pulse:  81 93 85  Temp:  98.1 F (36.7 C) 97.9 F (36.6 C) 98.6 F (37 C)  TempSrc:  Oral Oral Oral  Resp:  18 18 18   Height:      Weight:      SpO2:  99% 99% 97%    Intake/Output Summary (Last 24 hours) at 08/31/15 0814 Last data filed at 08/31/15 0700  Gross per 24 hour  Intake 2042.5 ml  Output      0 ml  Net 2042.5 ml   Filed Weights   08/26/15 1512  Weight: 55.1 kg (121 lb 7.6 oz)  Examination:  General exam: Overall looks better, we got her out of bed and ambulated her down the hallway Respiratory system: Clear to auscultation. Respiratory effort normal. Cardiovascular system: S1 & S2 heard, RRR. No JVD, murmurs, rubs, gallops or clicks. No pedal  edema. Gastrointestinal system: Abdomen is nondistended, soft and nontender. No organomegaly or masses felt. Normal bowel sounds heard. Central nervous system: Alert and oriented. No focal neurological deficits. Extremities: Symmetric 5 x 5 power. Skin: No rashes, lesions or ulcers Psychiatry: Judgement and insight appear normal. Mood & affect appropriate.     Data Reviewed: I have personally reviewed following labs and imaging studies  CBC:  Recent Labs Lab 08/26/15 1044 08/27/15 0420 08/28/15 0417 08/30/15 0441 08/31/15 0452  WBC 12.2* 12.2* 11.8* 10.9* 11.3*  HGB 13.1 12.6 12.8 12.5 12.0  HCT 35.7* 34.7* 35.9* 35.5* 33.4*  MCV 83.8 84.8 85.9 86.2 86.1  PLT 173 171 166 214 [id number]   Basic Metabolic Panel:  Recent Labs Lab 08/26/15 1044 08/27/15 0420 08/28/15 0417 08/29/15 0740 08/30/15 0441 08/31/15 0452  NA 115* 118* 120* 123* 124* 127*  K 2.1* 3.5 2.8* 3.4* 4.0 3.0*  CL 75* 86* 89* 92* 91* 93*  CO2 25 24 22 22 24 26   GLUCOSE 152* 108* 94 129* 108* 99  BUN 25* 15 12 7 7 8   CREATININE 0.79 0.60 0.47 0.49 0.48 0.47  CALCIUM 8.4* 7.8* 7.5* 7.8* 8.1* 8.0*  MG 1.7  --   --   --   --   --    GFR: Estimated Creatinine Clearance: 44.4 mL/min (by C-G formula based on Cr of 0.47). Liver Function Tests: No results for input(s): AST, ALT, ALKPHOS, BILITOT, PROT, ALBUMIN in the last 168 hours. No results for input(s): LIPASE, AMYLASE in the last 168 hours. No results for input(s): AMMONIA in the last 168 hours. Coagulation Profile: No results for input(s): INR, PROTIME in the last 168 hours. Cardiac Enzymes: No results for input(s): CKTOTAL, CKMB, CKMBINDEX, TROPONINI in the last 168 hours. BNP (last 3 results) No results for input(s): PROBNP in the last 8760 hours. HbA1C: No results for input(s): HGBA1C in the last 72 hours. CBG:  Recent Labs Lab 08/26/15 1029  GLUCAP 158*   Lipid Profile: No results for input(s): CHOL, HDL, LDLCALC, TRIG, CHOLHDL, LDLDIRECT in  the last 72 hours. Thyroid Function Tests: No results for input(s): TSH, T4TOTAL, FREET4, T3FREE, THYROIDAB in the last 72 hours. Anemia Panel: No results for input(s): VITAMINB12, FOLATE, FERRITIN, TIBC, IRON, RETICCTPCT in the last 72 hours. Urine analysis:    Component Value Date/Time   COLORURINE YELLOW 08/26/2015 2047   APPEARANCEUR CLOUDY* 08/26/2015 2047   LABSPEC 1.018 08/26/2015 2047   PHURINE 6.0 08/26/2015 2047   GLUCOSEU 250* 08/26/2015 2047   HGBUR TRACE* 08/26/2015 2047   BILIRUBINUR NEGATIVE 08/26/2015 2047   KETONESUR NEGATIVE 08/26/2015 2047   PROTEINUR NEGATIVE 08/26/2015 2047   UROBILINOGEN 0.2 09/07/2009 1216   NITRITE NEGATIVE 08/26/2015 2047   LEUKOCYTESUR MODERATE* 08/26/2015 2047   Sepsis Labs: @LABRCNTIP (procalcitonin:4,lacticidven:4)  )No results found for this or any previous visit (from the past 240 hour(s)).       Radiology Studies: No results found.      Scheduled Meds: . calcium carbonate  1 tablet Oral Daily  . cycloSPORINE  1 drop Both Eyes BID  . enoxaparin (LOVENOX) injection  40 mg Subcutaneous Q24H  . hydroxychloroquine  200 mg Oral Daily  . metoprolol tartrate  100 mg Oral BID  . nystatin  5 mL Oral TID PC & HS  . potassium chloride  40 mEq Oral Q6H  . ramipril  10 mg Oral Daily  . sodium chloride flush  3 mL Intravenous Q12H   Continuous Infusions: . sodium chloride 75 mL/hr at 08/31/15 0643     LOS: 5 days    Time spent: 25 min    Kelvin Cellar, MD [facility] Pager [phone]  If 7PM-7AM, please contact night-coverage [email or web address] Password [email or web address] 08/31/2015, 8:14 AM

## 2015-08-31 NOTE — Progress Notes (Signed)
Spoke with pt concerning discharge plans with sister and friend at bedside.  Pt selected [facility] for rehab.  Pt will not have 24 hour Supervision at home.

## 2015-08-31 NOTE — Clinical Social Work Placement (Signed)
Patient has a bed at [facility]. CSW has completed FL2 & will continue to follow and assist with discharge when ready.    Raynaldo Opitz, [facility] Clinical Social Worker cell #: [phone]     CLINICAL SOCIAL WORK PLACEMENT  NOTE  Date:  08/31/2015  Patient Details  Name: Sheila Pugh MRN: [id number] Date of Birth: [birth year]  Clinical Social Work is seeking post-discharge placement for this patient at the [place] level of care (*CSW will initial, date and re-position this form in  chart as items are completed):  Yes   Patient/family provided with [facility] Work Department's list of facilities offering this level of care within the geographic area requested by the patient (or if unable, by the patient's family).  Yes   Patient/family informed of their freedom to choose among providers that offer the needed level of care, that participate in Medicare, Medicaid or managed care program needed by the patient, have an available bed and are willing to accept the patient.  Yes   Patient/family informed of [facility]'s ownership interest in [facility] and [facility], as well as of the fact that they are under no obligation to receive care at these facilities.  PASRR submitted to EDS on 08/31/15     PASRR number received on 08/31/15     Existing PASRR number confirmed on       FL2 transmitted to all facilities in geographic area requested by pt/family on 08/31/15     FL2 transmitted to all facilities within larger geographic area on       Patient informed that his/her managed care company has contracts with or will negotiate with certain facilities, including the following:        Yes   Patient/family informed of bed offers received.  Patient chooses bed at [facility]     Physician recommends and patient chooses bed at      Patient to be transferred to [facility] on   .  Patient to be transferred to facility by       Patient family notified on   of transfer.  Name of family member notified:        PHYSICIAN       Additional Comment:    _______________________________________________ Standley Brooking, LCSW 08/31/2015, 2:11 PM

## 2015-08-31 NOTE — NC FL2 (Signed)
[place] MEDICAID FL2 LEVEL OF CARE SCREENING TOOL     IDENTIFICATION  Patient Name: Sheila Pugh Birthdate: 05-Jul-1935 Sex: female Admission Date (Current Location): 08/26/2015  [facility] [place] Number:  Herbalist and Address:  [facility],  [place] [address], [place]      Provider Number: [id number]  Attending Physician Name and Address:  Kelvin Cellar, MD  Relative Name and Phone Number:       Current Level of Care: Hospital Recommended Level of Care: [place] Prior Approval Number:    Date Approved/Denied:   PASRR Number: [id number]  Discharge Plan: Home    Current Diagnoses: Patient Active Problem List   Diagnosis Date Noted  . Generalized weakness 08/26/2015  . Hyponatremia 08/26/2015  . Hypokalemia 08/26/2015  . FTT (failure to thrive) in adult 08/26/2015  . Fall at home 08/26/2015    Orientation RESPIRATION BLADDER Height & Weight     Self, Time, Situation, Place  Normal Continent Weight: 121 lb 7.6 oz (55.1 kg) Height:  5\' 1"  (154.9 cm)  BEHAVIORAL SYMPTOMS/MOOD NEUROLOGICAL BOWEL NUTRITION STATUS      Continent Diet (Regular)  AMBULATORY STATUS COMMUNICATION OF NEEDS Skin   Limited Assist Verbally Normal                       Personal Care Assistance Level of Assistance  Bathing, Dressing Bathing Assistance: Limited assistance   Dressing Assistance: Limited assistance     Functional Limitations Info             SPECIAL CARE FACTORS FREQUENCY  PT (By licensed PT)     PT Frequency: 5 OT Frequency: 5            Contractures      Additional Factors Info  Code Status, Allergies Code Status Info: Fullcode Allergies Info: Allergies:  Sulfamethoxazole           Current Medications (08/31/2015):  This is the current hospital active medication list Current Facility-Administered Medications  Medication Dose Route Frequency Provider Last Rate Last Dose  . 0.9 %  sodium  chloride infusion   Intravenous Continuous Kelvin Cellar, MD 75 mL/hr at 08/31/15 [phone]    . acetaminophen (TYLENOL) tablet 650 mg  650 mg Oral Q6H PRN Kelvin Cellar, MD       Or  . acetaminophen (TYLENOL) suppository 650 mg  650 mg Rectal Q6H PRN Kelvin Cellar, MD      . acetaminophen (TYLENOL) tablet 650 mg  650 mg Oral Q6H PRN Kelvin Cellar, MD      . ALPRAZolam Duanne Moron) tablet 0.25 mg  0.25 mg Oral QHS PRN Kelvin Cellar, MD   0.25 mg at 08/28/15 2127  . calcium carbonate (TUMS - dosed in mg elemental calcium) chewable tablet 200 mg of elemental calcium  1 tablet Oral Daily Kelvin Cellar, MD   200 mg of elemental calcium at 08/31/15 0930  . cycloSPORINE (RESTASIS) 0.05 % ophthalmic emulsion 1 drop  1 drop Both Eyes BID Kelvin Cellar, MD   1 drop at 08/31/15 0932  . enoxaparin (LOVENOX) injection 40 mg  40 mg Subcutaneous Q24H Kelvin Cellar, MD   40 mg at 08/30/15 1742  . hydroxychloroquine (PLAQUENIL) tablet 200 mg  200 mg Oral Daily Kelvin Cellar, MD   200 mg at 08/31/15 0930  . metoprolol (LOPRESSOR) tablet 100 mg  100 mg Oral BID Kelvin Cellar, MD   100 mg at 08/31/15 0930  .  nystatin (MYCOSTATIN) 100000 UNIT/ML suspension 500,000 Units  5 mL Oral TID PC & HS Kelvin Cellar, MD   500,000 Units at 08/30/15 2156  . ondansetron (ZOFRAN) injection 4 mg  4 mg Intravenous Q6H PRN Hewitt Shorts [place], PA-C   4 mg at 08/26/15 2035  . potassium chloride SA (K-DUR,KLOR-CON) CR tablet 40 mEq  40 mEq Oral Q6H Kelvin Cellar, MD   40 mEq at 08/31/15 [id number]  . ramipril (ALTACE) capsule 10 mg  10 mg Oral Daily Kelvin Cellar, MD   10 mg at 08/31/15 0930  . sodium chloride (OCEAN) 0.65 % nasal spray 1 spray  1 spray Each Nare PRN Kelvin Cellar, MD      . sodium chloride flush (NS) 0.9 % injection 3 mL  3 mL Intravenous Q12H Kelvin Cellar, MD   3 mL at 08/30/15 2156  . zolpidem (AMBIEN) tablet 5 mg  5 mg Oral QHS PRN Jeryl Columbia, NP   5 mg at 08/31/15 0119     Discharge  Medications: Please see discharge summary for a list of discharge medications.  Relevant Imaging Results:  Relevant Lab Results:   Additional Information SSN: [id number]  Standley Brooking, LCSW

## 2015-09-01 DIAGNOSIS — E876 Hypokalemia: Secondary | ICD-10-CM

## 2015-09-01 DIAGNOSIS — R627 Adult failure to thrive: Secondary | ICD-10-CM

## 2015-09-01 DIAGNOSIS — M3500 Sicca syndrome, unspecified: Secondary | ICD-10-CM | POA: Diagnosis not present

## 2015-09-01 DIAGNOSIS — R531 Weakness: Secondary | ICD-10-CM | POA: Diagnosis not present

## 2015-09-01 DIAGNOSIS — R262 Difficulty in walking, not elsewhere classified: Secondary | ICD-10-CM | POA: Diagnosis not present

## 2015-09-01 DIAGNOSIS — M069 Rheumatoid arthritis, unspecified: Secondary | ICD-10-CM | POA: Diagnosis not present

## 2015-09-01 DIAGNOSIS — F329 Major depressive disorder, single episode, unspecified: Secondary | ICD-10-CM | POA: Diagnosis not present

## 2015-09-01 DIAGNOSIS — E871 Hypo-osmolality and hyponatremia: Secondary | ICD-10-CM

## 2015-09-01 DIAGNOSIS — E860 Dehydration: Secondary | ICD-10-CM | POA: Diagnosis not present

## 2015-09-01 DIAGNOSIS — R296 Repeated falls: Secondary | ICD-10-CM | POA: Diagnosis not present

## 2015-09-01 DIAGNOSIS — R278 Other lack of coordination: Secondary | ICD-10-CM | POA: Diagnosis not present

## 2015-09-01 DIAGNOSIS — R2689 Other abnormalities of gait and mobility: Secondary | ICD-10-CM | POA: Diagnosis not present

## 2015-09-01 DIAGNOSIS — R739 Hyperglycemia, unspecified: Secondary | ICD-10-CM | POA: Diagnosis not present

## 2015-09-01 DIAGNOSIS — I10 Essential (primary) hypertension: Secondary | ICD-10-CM | POA: Diagnosis not present

## 2015-09-01 DIAGNOSIS — E46 Unspecified protein-calorie malnutrition: Secondary | ICD-10-CM | POA: Diagnosis not present

## 2015-09-01 DIAGNOSIS — M6281 Muscle weakness (generalized): Secondary | ICD-10-CM | POA: Diagnosis not present

## 2015-09-01 DIAGNOSIS — E785 Hyperlipidemia, unspecified: Secondary | ICD-10-CM | POA: Diagnosis not present

## 2015-09-01 LAB — CBC
HEMATOCRIT: 34.1 % — AB (ref 36.0–46.0)
Hemoglobin: 12.1 g/dL (ref 12.0–15.0)
MCH: 31.2 pg (ref 26.0–34.0)
MCHC: 35.5 g/dL (ref 30.0–36.0)
MCV: 87.9 fL (ref 78.0–100.0)
Platelets: 256 10*3/uL (ref 150–400)
RBC: 3.88 MIL/uL (ref 3.87–5.11)
RDW: 13.4 % (ref 11.5–15.5)
WBC: 9.9 10*3/uL (ref 4.0–10.5)

## 2015-09-01 LAB — BASIC METABOLIC PANEL
Anion gap: 10 (ref 5–15)
BUN: 7 mg/dL (ref 6–20)
CHLORIDE: 93 mmol/L — AB (ref 101–111)
CO2: 26 mmol/L (ref 22–32)
Calcium: 8.2 mg/dL — ABNORMAL LOW (ref 8.9–10.3)
Creatinine, Ser: 0.57 mg/dL (ref 0.44–1.00)
GFR calc non Af Amer: >60 mL/min (ref >60)
Glucose, Bld: 125 mg/dL — ABNORMAL HIGH (ref 65–99)
POTASSIUM: 3.5 mmol/L (ref 3.5–5.1)
SODIUM: 129 mmol/L — AB (ref 135–145)

## 2015-09-01 MED ORDER — METOPROLOL TARTRATE 100 MG PO TABS: 100.0000 mg | ORAL_TABLET | Freq: Two times a day (BID) | ORAL | Status: DC

## 2015-09-01 MED ORDER — ALPRAZOLAM 0.25 MG PO TABS: 0.2500 mg | ORAL_TABLET | Freq: Every evening | ORAL | Status: DC | PRN

## 2015-09-01 NOTE — Clinical Social Work Placement (Signed)
   CLINICAL SOCIAL WORK PLACEMENT  NOTE  Date:  09/01/2015  Patient Details  Name: Sheila Pugh MRN: [id number] Date of Birth: 11-15-1935  Clinical Social Work is seeking post-discharge placement for this patient at the [place] level of care (*CSW will initial, date and re-position this form in  chart as items are completed):  Yes   Patient/family provided with [facility] Work Department's list of facilities offering this level of care within the geographic area requested by the patient (or if unable, by the patient's family).  Yes   Patient/family informed of their freedom to choose among providers that offer the needed level of care, that participate in Medicare, Medicaid or managed care program needed by the patient, have an available bed and are willing to accept the patient.  Yes   Patient/family informed of [facility]'s ownership interest in [facility] and [facility], as well as of the fact that they are under no obligation to receive care at these facilities.  PASRR submitted to EDS on 08/31/15     PASRR number received on 08/31/15     Existing PASRR number confirmed on       FL2 transmitted to all facilities in geographic area requested by pt/family on 08/31/15     FL2 transmitted to all facilities within larger geographic area on       Patient informed that his/her managed care company has contracts with or will negotiate with certain facilities, including the following:        Yes   Patient/family informed of bed offers received.  Patient chooses bed at [facility]     Physician recommends and patient chooses bed at      Patient to be transferred to [facility] on 09/01/15.  Patient to be transferred to facility by [facility]     Patient family notified on 09/01/15 of transfer.  Name of family member notified:  DAUGHTER     PHYSICIAN       Additional Comment: Pt d/c to [facility]  today. Pt / family were in agreement with this plan. Family transported pt by car. [employer] provided authorization for SNF placement. D/C Summary sent to SNF for review prior to d/c. Scripts included in d/c packet. D/C packet provided to family. # for report provided to nsg.   _______________________________________________ Luretha Rued, [place]  [phone] 09/01/2015, 6:00 PM

## 2015-09-01 NOTE — Progress Notes (Signed)
Report called to nurse Mardene Celeste at [facility]. Patient to be transported by family. Pt A&O, denies pain & discomfort. VSS.

## 2015-09-01 NOTE — Discharge Summary (Signed)
Physician Discharge Summary  [name] [id number] DOB: 07-04-1935 DOA: 08/26/2015  PCP: Orpah Melter, MD  Admit date: 08/26/2015 Discharge date: 09/01/2015  Time spent: 35 minutes  Recommendations for Outpatient Follow-up:  1. PCP in 1 week, please check Bmet at FU   Discharge Diagnoses:  Active Problems:   Generalized weakness   Hyponatremia   Hypokalemia   FTT (failure to thrive) in adult   Fall at home   Hypertension  Discharge Condition: stable  Diet recommendation: low sodium  Filed Weights   08/26/15 1512  Weight: 55.1 kg (121 lb 7.6 oz)    History of present illness:  Sheila Pugh is a 80 year old female with a history of hypertension, dyslipidemia, admitted to the medicine service on 08/26/2015 when she presented with complaints of generalized weakness, recurrent falls, failure to thrive. Lab work revealed sodium of 115 with a potassium of 2.1  Hospital Course:  Failure to thrive/functional decline. -Sheila Pugh is a 80 year old residing in the community, presented with a steep functional decline over the past several weeks characterized by recurrent falls, decreased activity, minimal by mouth intake -Suspect dehydration and electrolyte abnormalities as precipitating event. -Found to have sodium 115 and potassium of 2.1, now corrected -Physical therapy consulted recommending SNF for rehab -clinically improving with correction of Na and K  2. Hyponatremia. -She presented with a sodium of 115 and appeared dry and physical examination. -She had been on hydrochlorothiazide which contributed to her electrolyte abnormalities. Furthermore this was likely compounded by functional decline having minimal by mouth intake. -Her sodium level slowly trending up, a.m. lab work on 08/31/2015 showing improving sodium of 129 -IVf stopped -HCTZ discontinued  3. Hypokalemia -Presented with a potassium of 2.1 -Likely secondary to combination of GI losses,  hydrochlorothiazide, and decreased by mouth intake - During this hospitalization she has received IV and by mouth replacement.  -corrected  4. Hypertension -She is metoprolol 100 mg by mouth twice a day and ramipril 10 mg by mouth daily -HCTZ stopped due to electrolyte abnormalities and dehydration  5. Recurrent falls -I secondary to electrolyte abnormalities, hypokalemia, hyponatremia, dehydration, functional decline -Physical therapy consulted recommending SNF for rehab  6. Mild hyperglycemia -FU Hbaic, pending at discharge  Discharge Exam: Filed Vitals:   09/01/15 0450 09/01/15 0646  BP: 177/70 160/77  Pulse: 83   Temp: 98.6 F (37 C)   Resp: 20     General: AAOx3 Cardiovascular: S1S2/RRR Respiratory: CTAB  Discharge Instructions   Discharge Instructions    Diet - low sodium heart healthy    Complete by:  As directed      Increase activity slowly    Complete by:  As directed           Current Discharge Medication List    START taking these medications   Details  metoprolol (LOPRESSOR) 100 MG tablet Take 1 tablet (100 mg total) by mouth 2 (two) times daily.      CONTINUE these medications which have CHANGED   Details  ALPRAZolam (XANAX) 0.25 MG tablet Take 1 tablet (0.25 mg total) by mouth at bedtime as needed for sleep. sleep Qty: 10 tablet, Refills: 0      CONTINUE these medications which have NOT CHANGED   Details  acetaminophen (TYLENOL) 500 MG tablet Take 1,000 mg by mouth every 6 (six) hours as needed for moderate pain.    Calcium Carbonate-Vit D-Min (CALCIUM 1200 PO) Take 1,200 mg by mouth daily.    cholecalciferol (VITAMIN D) 1000 UNITS tablet  Take 1,000 Units by mouth daily.      cycloSPORINE (RESTASIS) 0.05 % ophthalmic emulsion 1 drop 2 (two) times daily. Place one drop into affected eye twice each day.    hydroxychloroquine (PLAQUENIL) 200 MG tablet Take 200 mg by mouth daily.     letrozole (FEMARA) 2.5 MG tablet Take 2.5 mg by mouth  daily.    nystatin (MYCOSTATIN) 100000 UNIT/ML suspension Take 5 mLs by mouth 4 (four) times daily - after meals and at bedtime. 1 dose, swish 4 times a day    ramipril (ALTACE) 10 MG capsule Take 10 mg by mouth daily.        STOP taking these medications     metoprolol-hydrochlorothiazide (LOPRESSOR HCT) 100-25 MG per tablet        Allergies  Allergen Reactions  . Sulfamethoxazole Nausea And Vomiting   Follow-up Information    Follow up with [facility].   Specialty:  [place] information:   [place] [facility] [place] [phone]      Follow up with Orpah Melter, MD. Schedule an appointment as soon as possible for a visit in 1 week.   Specialty:  Family Medicine   Contact information:   [place] [address] [phone]        The results of significant diagnostics from this hospitalization (including imaging, microbiology, ancillary and laboratory) are listed below for reference.    Significant Diagnostic Studies: Dg Chest 2 View  08/26/2015  CLINICAL DATA:  Leukocytosis EXAM: CHEST  2 VIEW COMPARISON:  09/07/2009 FINDINGS: The heart size and mediastinal contours are within normal limits. Both lungs are clear. The visualized skeletal structures are unremarkable. IMPRESSION: No active cardiopulmonary disease. Electronically Signed   By: Inez Catalina M.D.   On: 08/26/2015 12:22   Ct Head Wo Contrast  08/26/2015  CLINICAL DATA:  Fall. EXAM: CT HEAD WITHOUT CONTRAST TECHNIQUE: Contiguous axial images were obtained from the base of the skull through the vertex without intravenous contrast. COMPARISON:  CT head 08/22/2009 FINDINGS: Mild atrophy.  Negative for hydrocephalus Patchy hypodensity throughout the cerebral white matter and subcortical white matter is similar to the prior study. Findings most consistent with chronic microvascular ischemia. Negative for acute infarct.   Negative for hemorrhage or mass. Calvarium is normal. IMPRESSION: Extensive hypodensity throughout the cerebral white matter bilaterally stable since 2011. Findings most consistent with chronic microvascular ischemia. No acute abnormality Electronically Signed   By: Franchot Gallo M.D.   On: 08/26/2015 12:19    Microbiology: No results found for this or any previous visit (from the past 240 hour(s)).   Labs: Basic Metabolic Panel:  Recent Labs Lab 08/26/15 1044  08/28/15 0417 08/29/15 0740 08/30/15 0441 08/31/15 0452 09/01/15 0520  NA 115*  < > 120* 123* 124* 127* 129*  K 2.1*  < > 2.8* 3.4* 4.0 3.0* 3.5  CL 75*  < > 89* 92* 91* 93* 93*  CO2 25  < > 22 22 24 26 26   GLUCOSE 152*  < > 94 129* 108* 99 125*  BUN 25*  < > 12 7 7 8 7   CREATININE 0.79  < > 0.47 0.49 0.48 0.47 0.57  CALCIUM 8.4*  < > 7.5* 7.8* 8.1* 8.0* 8.2*  MG 1.7  --   --   --   --   --   --   < > = values in this interval not displayed. Liver  Function Tests: No results for input(s): AST, ALT, ALKPHOS, BILITOT, PROT, ALBUMIN in the last 168 hours. No results for input(s): LIPASE, AMYLASE in the last 168 hours. No results for input(s): AMMONIA in the last 168 hours. CBC:  Recent Labs Lab 08/27/15 0420 08/28/15 0417 08/30/15 0441 08/31/15 0452 09/01/15 0520  WBC 12.2* 11.8* 10.9* 11.3* 9.9  HGB 12.6 12.8 12.5 12.0 12.1  HCT 34.7* 35.9* 35.5* 33.4* 34.1*  MCV 84.8 85.9 86.2 86.1 87.9  PLT 171 166 214 239 256   Cardiac Enzymes: No results for input(s): CKTOTAL, CKMB, CKMBINDEX, TROPONINI in the last 168 hours. BNP: BNP (last 3 results) No results for input(s): BNP in the last 8760 hours.  ProBNP (last 3 results) No results for input(s): PROBNP in the last 8760 hours.  CBG:  Recent Labs Lab 08/26/15 1029  GLUCAP 158*       SignedDomenic Polite MD.  [facility] 09/01/2015, 11:52 AM

## 2015-09-02 LAB — HEMOGLOBIN A1C
HEMOGLOBIN A1C: 6 % — AB (ref 4.8–5.6)
MEAN PLASMA GLUCOSE: 126 mg/dL

## 2015-09-03 DIAGNOSIS — R296 Repeated falls: Secondary | ICD-10-CM | POA: Diagnosis not present

## 2015-09-03 DIAGNOSIS — I10 Essential (primary) hypertension: Secondary | ICD-10-CM | POA: Diagnosis not present

## 2015-09-03 DIAGNOSIS — E871 Hypo-osmolality and hyponatremia: Secondary | ICD-10-CM | POA: Diagnosis not present

## 2015-09-03 DIAGNOSIS — E785 Hyperlipidemia, unspecified: Secondary | ICD-10-CM | POA: Diagnosis not present

## 2015-09-04 DIAGNOSIS — R2689 Other abnormalities of gait and mobility: Secondary | ICD-10-CM | POA: Diagnosis not present

## 2015-09-04 DIAGNOSIS — I10 Essential (primary) hypertension: Secondary | ICD-10-CM | POA: Diagnosis not present

## 2015-09-04 DIAGNOSIS — E871 Hypo-osmolality and hyponatremia: Secondary | ICD-10-CM | POA: Diagnosis not present

## 2015-09-04 DIAGNOSIS — E860 Dehydration: Secondary | ICD-10-CM | POA: Diagnosis not present

## 2015-09-04 DIAGNOSIS — E46 Unspecified protein-calorie malnutrition: Secondary | ICD-10-CM | POA: Diagnosis not present

## 2015-09-04 DIAGNOSIS — R531 Weakness: Secondary | ICD-10-CM | POA: Diagnosis not present

## 2015-09-04 DIAGNOSIS — M3500 Sicca syndrome, unspecified: Secondary | ICD-10-CM | POA: Diagnosis not present

## 2015-09-04 DIAGNOSIS — F329 Major depressive disorder, single episode, unspecified: Secondary | ICD-10-CM | POA: Diagnosis not present

## 2015-09-07 DIAGNOSIS — I10 Essential (primary) hypertension: Secondary | ICD-10-CM | POA: Diagnosis not present

## 2015-09-07 DIAGNOSIS — E785 Hyperlipidemia, unspecified: Secondary | ICD-10-CM | POA: Diagnosis not present

## 2015-09-07 DIAGNOSIS — E871 Hypo-osmolality and hyponatremia: Secondary | ICD-10-CM | POA: Diagnosis not present

## 2015-09-07 DIAGNOSIS — R739 Hyperglycemia, unspecified: Secondary | ICD-10-CM | POA: Diagnosis not present

## 2015-09-09 DIAGNOSIS — R739 Hyperglycemia, unspecified: Secondary | ICD-10-CM | POA: Diagnosis not present

## 2015-09-09 DIAGNOSIS — E871 Hypo-osmolality and hyponatremia: Secondary | ICD-10-CM | POA: Diagnosis not present

## 2015-09-09 DIAGNOSIS — I10 Essential (primary) hypertension: Secondary | ICD-10-CM | POA: Diagnosis not present

## 2015-09-16 DIAGNOSIS — R202 Paresthesia of skin: Secondary | ICD-10-CM | POA: Diagnosis not present

## 2015-09-16 DIAGNOSIS — I10 Essential (primary) hypertension: Secondary | ICD-10-CM | POA: Diagnosis not present

## 2015-09-16 DIAGNOSIS — R531 Weakness: Secondary | ICD-10-CM | POA: Diagnosis not present

## 2015-09-23 DIAGNOSIS — H5210 Myopia, unspecified eye: Secondary | ICD-10-CM | POA: Diagnosis not present

## 2015-09-23 DIAGNOSIS — Z0100 Encounter for examination of eyes and vision without abnormal findings: Secondary | ICD-10-CM | POA: Diagnosis not present

## 2015-09-23 DIAGNOSIS — H524 Presbyopia: Secondary | ICD-10-CM | POA: Diagnosis not present

## 2015-09-23 DIAGNOSIS — H3530 Unspecified macular degeneration: Secondary | ICD-10-CM | POA: Diagnosis not present

## 2015-10-25 DIAGNOSIS — I10 Essential (primary) hypertension: Secondary | ICD-10-CM | POA: Diagnosis not present

## 2015-10-25 DIAGNOSIS — F411 Generalized anxiety disorder: Secondary | ICD-10-CM | POA: Diagnosis not present

## 2015-10-25 DIAGNOSIS — L0291 Cutaneous abscess, unspecified: Secondary | ICD-10-CM | POA: Diagnosis not present

## 2015-11-05 DIAGNOSIS — K118 Other diseases of salivary glands: Secondary | ICD-10-CM | POA: Diagnosis not present

## 2015-11-05 DIAGNOSIS — R221 Localized swelling, mass and lump, neck: Secondary | ICD-10-CM | POA: Diagnosis not present

## 2015-11-09 ENCOUNTER — Other Ambulatory Visit: Payer: Self-pay | Admitting: Otolaryngology

## 2015-11-09 DIAGNOSIS — R609 Edema, unspecified: Secondary | ICD-10-CM

## 2015-11-09 DIAGNOSIS — R221 Localized swelling, mass and lump, neck: Secondary | ICD-10-CM

## 2015-11-16 ENCOUNTER — Ambulatory Visit
Admission: RE | Admit: 2015-11-16 | Discharge: 2015-11-16 | Disposition: A | Discharge status: Home / Self Care | Payer: Commercial Managed Care - HMO | Source: Ambulatory Visit | Attending: Otolaryngology | Admitting: Otolaryngology

## 2015-11-16 DIAGNOSIS — R221 Localized swelling, mass and lump, neck: Secondary | ICD-10-CM | POA: Diagnosis not present

## 2015-11-16 DIAGNOSIS — R609 Edema, unspecified: Secondary | ICD-10-CM

## 2015-11-16 MED ORDER — IOPAMIDOL (ISOVUE-300) INJECTION 61%
75.0000 mL | Freq: Once | INTRAVENOUS | Status: AC | PRN
  Administered 2015-11-16: 75 mL via INTRAVENOUS

## 2015-11-18 DIAGNOSIS — R221 Localized swelling, mass and lump, neck: Secondary | ICD-10-CM | POA: Diagnosis not present

## 2015-12-02 ENCOUNTER — Other Ambulatory Visit: Payer: Self-pay | Admitting: Otolaryngology

## 2015-12-02 DIAGNOSIS — D234 Other benign neoplasm of skin of scalp and neck: Secondary | ICD-10-CM | POA: Diagnosis not present

## 2015-12-02 DIAGNOSIS — R221 Localized swelling, mass and lump, neck: Secondary | ICD-10-CM | POA: Diagnosis not present

## 2015-12-02 DIAGNOSIS — L570 Actinic keratosis: Secondary | ICD-10-CM | POA: Diagnosis not present

## 2015-12-02 DIAGNOSIS — M7989 Other specified soft tissue disorders: Secondary | ICD-10-CM | POA: Diagnosis not present

## 2016-01-04 DIAGNOSIS — I7300 Raynaud's syndrome without gangrene: Secondary | ICD-10-CM | POA: Diagnosis not present

## 2016-01-04 DIAGNOSIS — Z79899 Other long term (current) drug therapy: Secondary | ICD-10-CM | POA: Diagnosis not present

## 2016-01-04 DIAGNOSIS — M3500 Sicca syndrome, unspecified: Secondary | ICD-10-CM | POA: Diagnosis not present

## 2016-02-11 DIAGNOSIS — R221 Localized swelling, mass and lump, neck: Secondary | ICD-10-CM | POA: Diagnosis not present

## 2016-02-22 DIAGNOSIS — Z79899 Other long term (current) drug therapy: Secondary | ICD-10-CM | POA: Diagnosis not present

## 2016-02-28 DIAGNOSIS — R221 Localized swelling, mass and lump, neck: Secondary | ICD-10-CM | POA: Diagnosis not present

## 2016-04-03 DIAGNOSIS — J069 Acute upper respiratory infection, unspecified: Secondary | ICD-10-CM | POA: Diagnosis not present

## 2016-05-09 DIAGNOSIS — H02839 Dermatochalasis of unspecified eye, unspecified eyelid: Secondary | ICD-10-CM | POA: Diagnosis not present

## 2016-05-09 DIAGNOSIS — H18413 Arcus senilis, bilateral: Secondary | ICD-10-CM | POA: Diagnosis not present

## 2016-05-09 DIAGNOSIS — H2511 Age-related nuclear cataract, right eye: Secondary | ICD-10-CM | POA: Diagnosis not present

## 2016-05-09 DIAGNOSIS — H25043 Posterior subcapsular polar age-related cataract, bilateral: Secondary | ICD-10-CM | POA: Diagnosis not present

## 2016-05-09 DIAGNOSIS — H2513 Age-related nuclear cataract, bilateral: Secondary | ICD-10-CM | POA: Diagnosis not present

## 2016-05-09 DIAGNOSIS — H25013 Cortical age-related cataract, bilateral: Secondary | ICD-10-CM | POA: Diagnosis not present

## 2016-06-02 DIAGNOSIS — Z79811 Long term (current) use of aromatase inhibitors: Secondary | ICD-10-CM | POA: Diagnosis not present

## 2016-06-02 DIAGNOSIS — Z170 Estrogen receptor positive status [ER+]: Secondary | ICD-10-CM | POA: Diagnosis not present

## 2016-06-02 DIAGNOSIS — C50211 Malignant neoplasm of upper-inner quadrant of right female breast: Secondary | ICD-10-CM | POA: Diagnosis not present

## 2016-06-02 DIAGNOSIS — C50911 Malignant neoplasm of unspecified site of right female breast: Secondary | ICD-10-CM | POA: Diagnosis not present

## 2016-06-02 DIAGNOSIS — Z853 Personal history of malignant neoplasm of breast: Secondary | ICD-10-CM | POA: Diagnosis not present

## 2016-06-14 DIAGNOSIS — Z0000 Encounter for general adult medical examination without abnormal findings: Secondary | ICD-10-CM | POA: Diagnosis not present

## 2016-06-14 DIAGNOSIS — L989 Disorder of the skin and subcutaneous tissue, unspecified: Secondary | ICD-10-CM | POA: Diagnosis not present

## 2016-06-14 DIAGNOSIS — M8588 Other specified disorders of bone density and structure, other site: Secondary | ICD-10-CM | POA: Diagnosis not present

## 2016-06-14 DIAGNOSIS — E7800 Pure hypercholesterolemia, unspecified: Secondary | ICD-10-CM | POA: Diagnosis not present

## 2016-06-14 DIAGNOSIS — M3500 Sicca syndrome, unspecified: Secondary | ICD-10-CM | POA: Diagnosis not present

## 2016-06-14 DIAGNOSIS — N183 Chronic kidney disease, stage 3 (moderate): Secondary | ICD-10-CM | POA: Diagnosis not present

## 2016-06-14 DIAGNOSIS — F411 Generalized anxiety disorder: Secondary | ICD-10-CM | POA: Diagnosis not present

## 2016-06-14 DIAGNOSIS — J309 Allergic rhinitis, unspecified: Secondary | ICD-10-CM | POA: Diagnosis not present

## 2016-06-14 DIAGNOSIS — I10 Essential (primary) hypertension: Secondary | ICD-10-CM | POA: Diagnosis not present

## 2016-07-03 DIAGNOSIS — I7300 Raynaud's syndrome without gangrene: Secondary | ICD-10-CM | POA: Diagnosis not present

## 2016-07-03 DIAGNOSIS — Z79899 Other long term (current) drug therapy: Secondary | ICD-10-CM | POA: Diagnosis not present

## 2016-07-03 DIAGNOSIS — M3500 Sicca syndrome, unspecified: Secondary | ICD-10-CM | POA: Diagnosis not present

## 2016-07-03 DIAGNOSIS — Z6822 Body mass index (BMI) 22.0-22.9, adult: Secondary | ICD-10-CM | POA: Diagnosis not present

## 2016-07-11 DIAGNOSIS — E7800 Pure hypercholesterolemia, unspecified: Secondary | ICD-10-CM | POA: Diagnosis not present

## 2016-07-11 DIAGNOSIS — I10 Essential (primary) hypertension: Secondary | ICD-10-CM | POA: Diagnosis not present

## 2016-07-28 DIAGNOSIS — R221 Localized swelling, mass and lump, neck: Secondary | ICD-10-CM | POA: Diagnosis not present

## 2016-07-28 DIAGNOSIS — R609 Edema, unspecified: Secondary | ICD-10-CM | POA: Diagnosis not present

## 2016-08-04 DIAGNOSIS — S0086XA Insect bite (nonvenomous) of other part of head, initial encounter: Secondary | ICD-10-CM | POA: Diagnosis not present

## 2016-08-04 DIAGNOSIS — L821 Other seborrheic keratosis: Secondary | ICD-10-CM | POA: Diagnosis not present

## 2016-08-04 DIAGNOSIS — L820 Inflamed seborrheic keratosis: Secondary | ICD-10-CM | POA: Diagnosis not present

## 2016-08-22 DIAGNOSIS — M3509 Sicca syndrome with other organ involvement: Secondary | ICD-10-CM | POA: Diagnosis not present

## 2016-08-22 DIAGNOSIS — R221 Localized swelling, mass and lump, neck: Secondary | ICD-10-CM | POA: Diagnosis not present

## 2016-10-09 DIAGNOSIS — H2511 Age-related nuclear cataract, right eye: Secondary | ICD-10-CM | POA: Diagnosis not present

## 2016-10-09 DIAGNOSIS — H2513 Age-related nuclear cataract, bilateral: Secondary | ICD-10-CM | POA: Diagnosis not present

## 2016-10-10 DIAGNOSIS — H2512 Age-related nuclear cataract, left eye: Secondary | ICD-10-CM | POA: Diagnosis not present

## 2016-10-23 DIAGNOSIS — H2512 Age-related nuclear cataract, left eye: Secondary | ICD-10-CM | POA: Diagnosis not present

## 2016-11-14 DIAGNOSIS — R6881 Early satiety: Secondary | ICD-10-CM | POA: Diagnosis not present

## 2016-11-14 DIAGNOSIS — R5383 Other fatigue: Secondary | ICD-10-CM | POA: Diagnosis not present

## 2016-11-14 DIAGNOSIS — R42 Dizziness and giddiness: Secondary | ICD-10-CM | POA: Diagnosis not present

## 2016-11-17 DIAGNOSIS — J189 Pneumonia, unspecified organism: Secondary | ICD-10-CM | POA: Diagnosis not present

## 2016-11-17 DIAGNOSIS — R627 Adult failure to thrive: Secondary | ICD-10-CM | POA: Diagnosis not present

## 2016-11-17 DIAGNOSIS — R5383 Other fatigue: Secondary | ICD-10-CM | POA: Diagnosis not present

## 2016-11-20 DIAGNOSIS — N179 Acute kidney failure, unspecified: Secondary | ICD-10-CM | POA: Diagnosis not present

## 2017-01-03 DIAGNOSIS — Z79899 Other long term (current) drug therapy: Secondary | ICD-10-CM | POA: Diagnosis not present

## 2017-01-03 DIAGNOSIS — Z6823 Body mass index (BMI) 23.0-23.9, adult: Secondary | ICD-10-CM | POA: Diagnosis not present

## 2017-01-03 DIAGNOSIS — M3500 Sicca syndrome, unspecified: Secondary | ICD-10-CM | POA: Diagnosis not present

## 2017-01-03 DIAGNOSIS — I7300 Raynaud's syndrome without gangrene: Secondary | ICD-10-CM | POA: Diagnosis not present

## 2017-01-18 DIAGNOSIS — E782 Mixed hyperlipidemia: Secondary | ICD-10-CM | POA: Diagnosis not present

## 2017-01-18 DIAGNOSIS — N183 Chronic kidney disease, stage 3 (moderate): Secondary | ICD-10-CM | POA: Diagnosis not present

## 2017-01-18 DIAGNOSIS — I10 Essential (primary) hypertension: Secondary | ICD-10-CM | POA: Diagnosis not present

## 2017-01-18 DIAGNOSIS — Z23 Encounter for immunization: Secondary | ICD-10-CM | POA: Diagnosis not present

## 2017-05-15 DIAGNOSIS — M7582 Other shoulder lesions, left shoulder: Secondary | ICD-10-CM | POA: Diagnosis not present

## 2017-06-19 DIAGNOSIS — Z0000 Encounter for general adult medical examination without abnormal findings: Secondary | ICD-10-CM | POA: Diagnosis not present

## 2017-06-19 DIAGNOSIS — I7300 Raynaud's syndrome without gangrene: Secondary | ICD-10-CM | POA: Diagnosis not present

## 2017-06-19 DIAGNOSIS — M3500 Sicca syndrome, unspecified: Secondary | ICD-10-CM | POA: Diagnosis not present

## 2017-06-19 DIAGNOSIS — I10 Essential (primary) hypertension: Secondary | ICD-10-CM | POA: Diagnosis not present

## 2017-06-19 DIAGNOSIS — Z79899 Other long term (current) drug therapy: Secondary | ICD-10-CM | POA: Diagnosis not present

## 2017-06-19 DIAGNOSIS — E782 Mixed hyperlipidemia: Secondary | ICD-10-CM | POA: Diagnosis not present

## 2017-06-19 IMAGING — CT CT HEAD W/O CM
1 series · 16 of 30 positions shown, 20 images · non-contrast
Comparison: CT head 08/22/2009

CLINICAL DATA: Fall.

EXAM:
CT HEAD WITHOUT CONTRAST
TECHNIQUE: Contiguous axial images were obtained from the base of the skull
through the vertex without intravenous contrast.

[Series 2: head_seq 4.5 h37s st · axial · 0.46mm/px · z∈[-151,-7]mm · 16 of 36 slices shown, 20 images]
[im 2/36  brain]
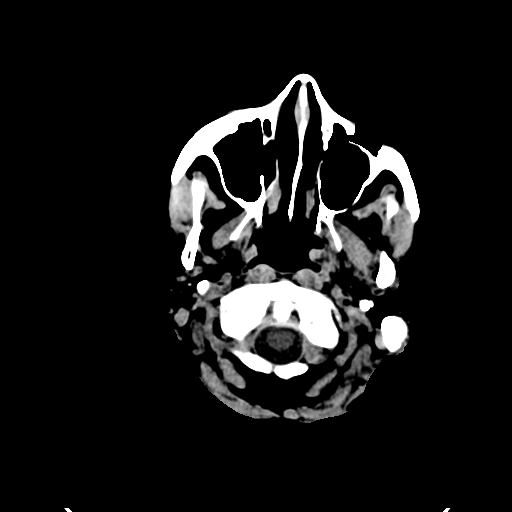
[im 2/36  bone]
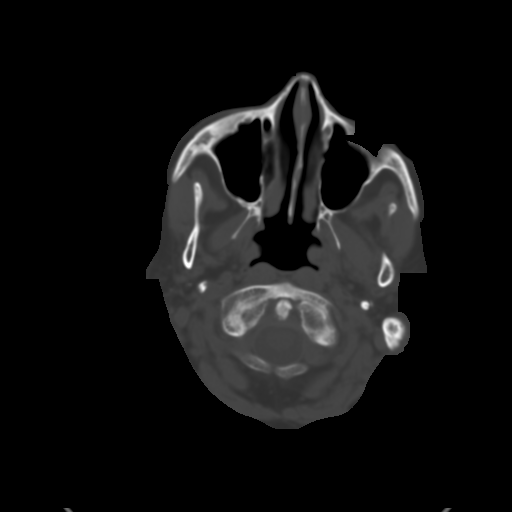
[im 4/36  brain]
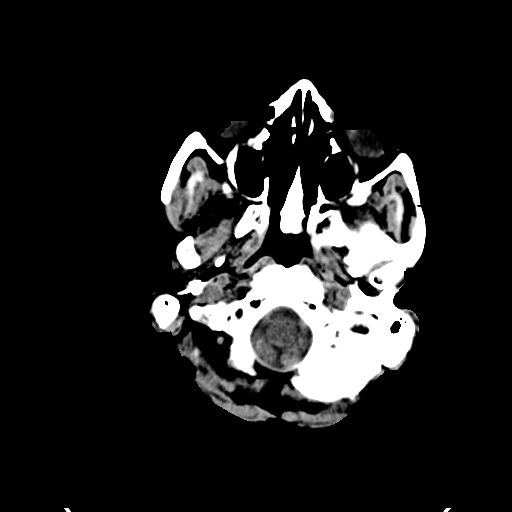
[im 7/36  brain]
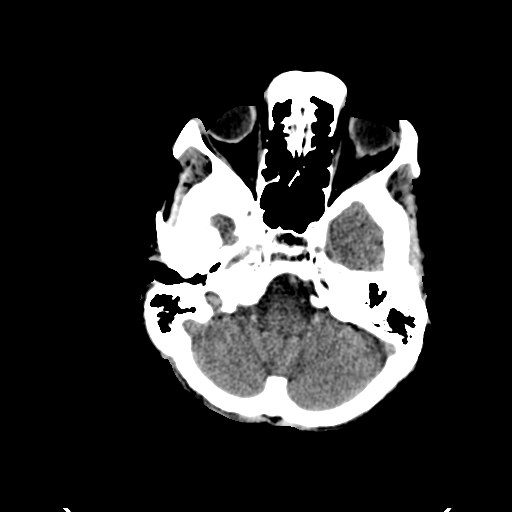
[im 9/36  brain]
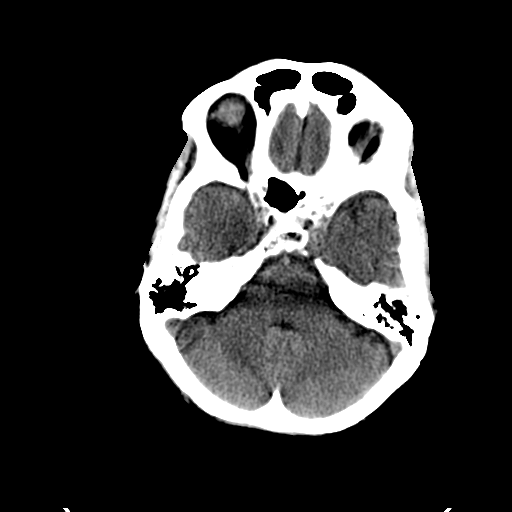
[im 10/36  brain]
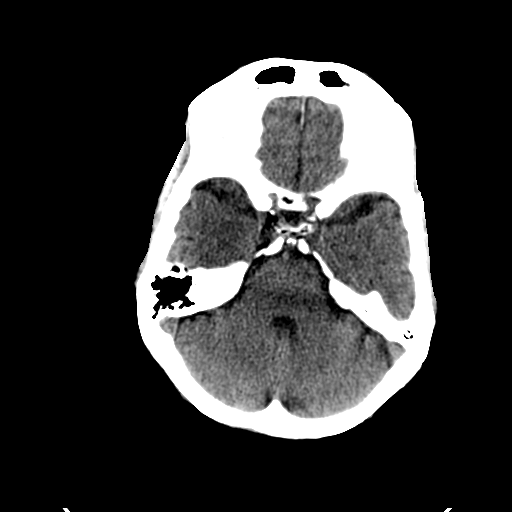
[im 10/36  bone]
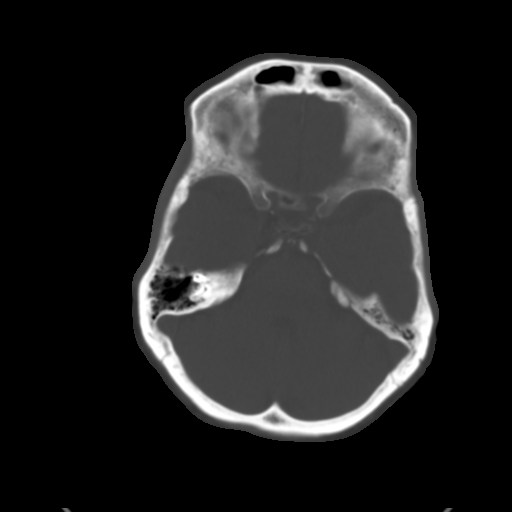
[im 13/36  brain]
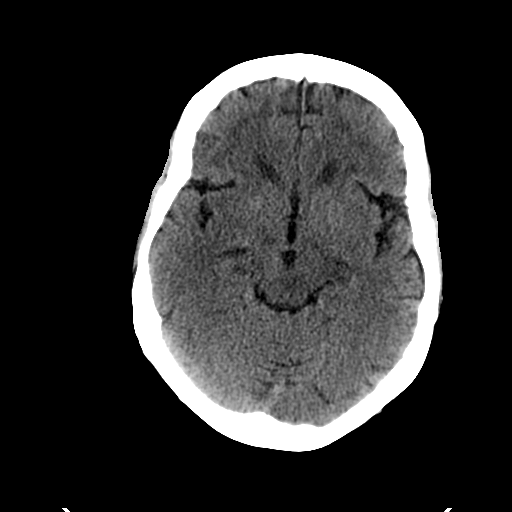
[im 15/36  brain]
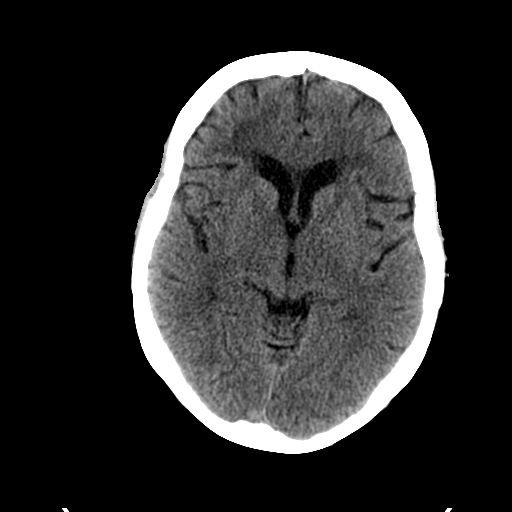
[im 17/36  brain]
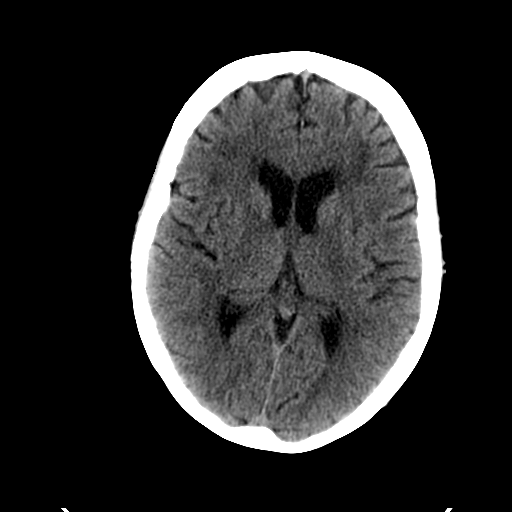
[im 19/36  brain]
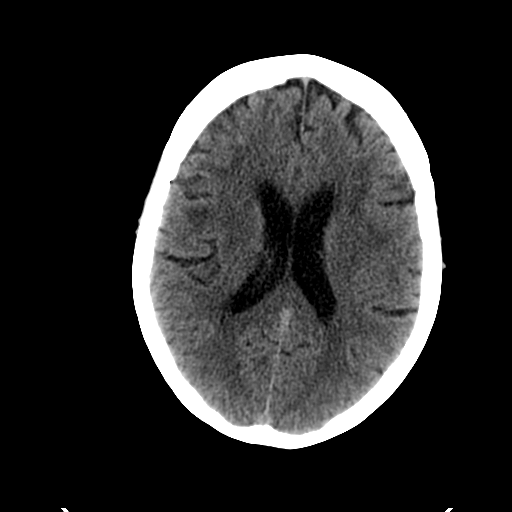
[im 19/36  bone]
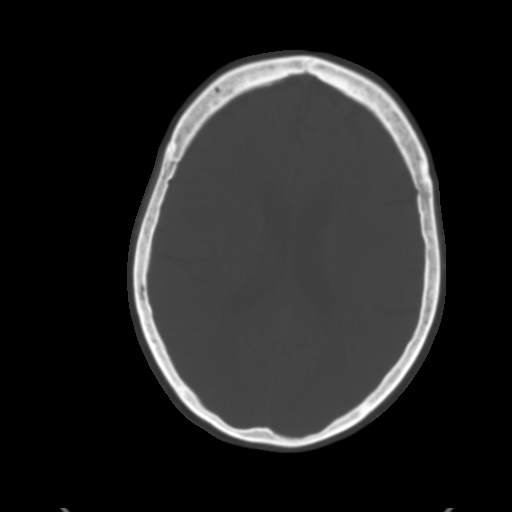
[im 21/36  brain]
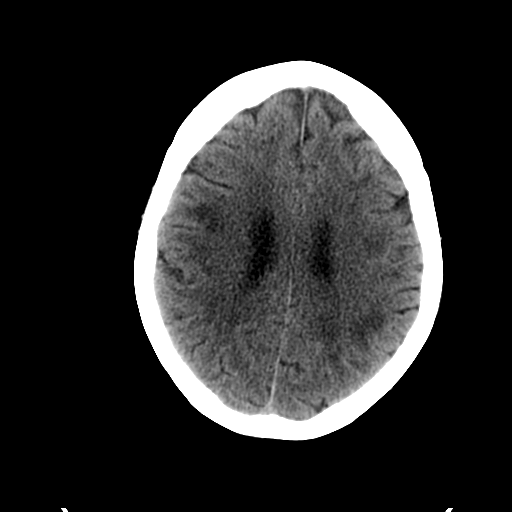
[im 23/36  brain]
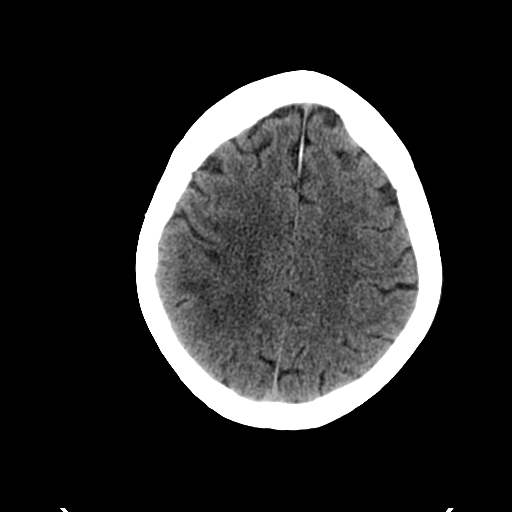
[im 26/36  brain]
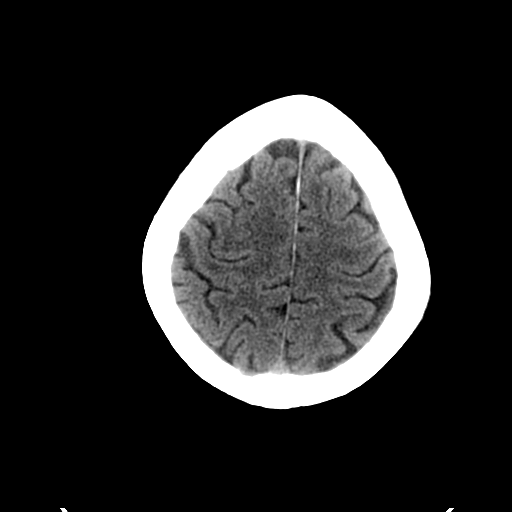
[im 27/36  brain]
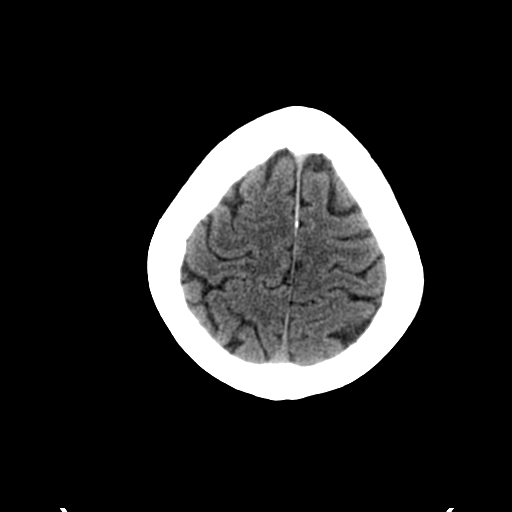
[im 27/36  bone]
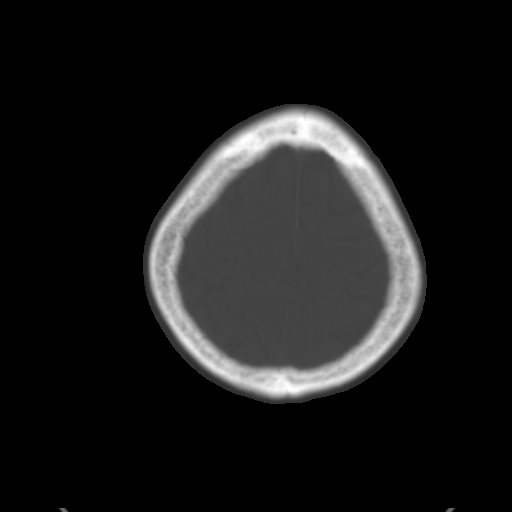
[im 29/36  brain]
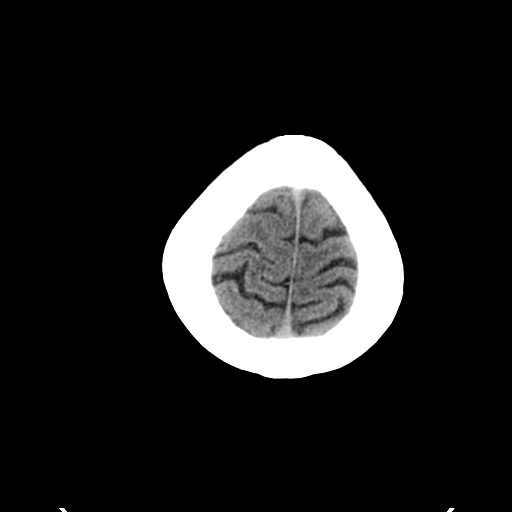
[im 32/36  brain]
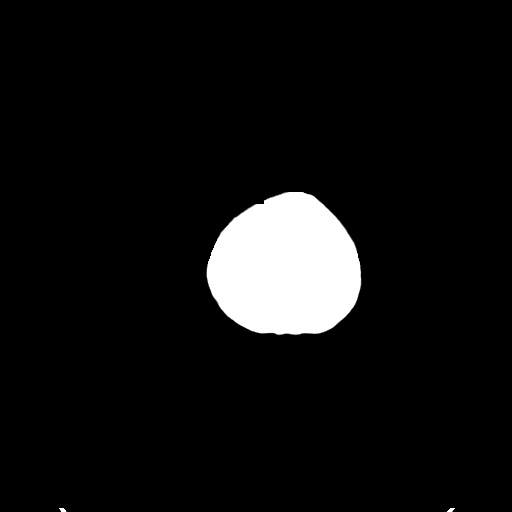
[im 34/36  brain]
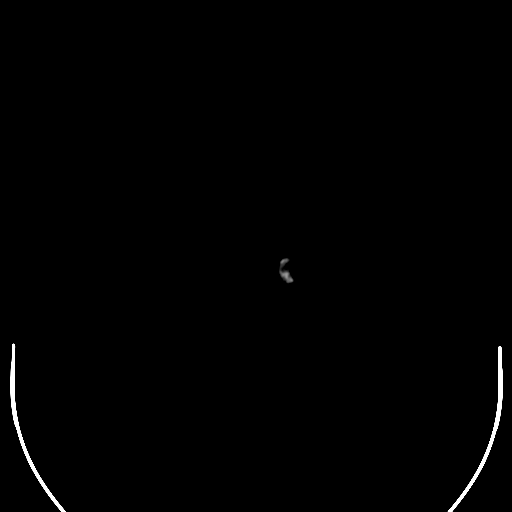

[16 of 30 positions shown; findings below may reference images not displayed]

FINDINGS: Mild atrophy.  Negative for hydrocephalus

Patchy hypodensity throughout the cerebral white matter and
subcortical white matter is similar to the prior study. Findings
most consistent with chronic microvascular ischemia.

Negative for acute infarct.  Negative for hemorrhage or mass.

Calvarium is normal.
IMPRESSION: Extensive hypodensity throughout the cerebral white matter
bilaterally stable since 7711. Findings most consistent with chronic
microvascular ischemia.

No acute abnormality

## 2017-07-03 DIAGNOSIS — Z6823 Body mass index (BMI) 23.0-23.9, adult: Secondary | ICD-10-CM | POA: Diagnosis not present

## 2017-07-03 DIAGNOSIS — I7300 Raynaud's syndrome without gangrene: Secondary | ICD-10-CM | POA: Diagnosis not present

## 2017-07-03 DIAGNOSIS — Z79899 Other long term (current) drug therapy: Secondary | ICD-10-CM | POA: Diagnosis not present

## 2017-07-03 DIAGNOSIS — M3500 Sicca syndrome, unspecified: Secondary | ICD-10-CM | POA: Diagnosis not present

## 2017-07-06 DIAGNOSIS — Z170 Estrogen receptor positive status [ER+]: Secondary | ICD-10-CM | POA: Diagnosis not present

## 2017-07-06 DIAGNOSIS — C50511 Malignant neoplasm of lower-outer quadrant of right female breast: Secondary | ICD-10-CM | POA: Diagnosis not present

## 2017-07-06 DIAGNOSIS — C50211 Malignant neoplasm of upper-inner quadrant of right female breast: Secondary | ICD-10-CM | POA: Diagnosis not present

## 2017-09-12 DIAGNOSIS — M8588 Other specified disorders of bone density and structure, other site: Secondary | ICD-10-CM | POA: Diagnosis not present

## 2017-11-06 DIAGNOSIS — Z79899 Other long term (current) drug therapy: Secondary | ICD-10-CM | POA: Diagnosis not present

## 2017-11-06 DIAGNOSIS — H524 Presbyopia: Secondary | ICD-10-CM | POA: Diagnosis not present

## 2017-12-06 DIAGNOSIS — M810 Age-related osteoporosis without current pathological fracture: Secondary | ICD-10-CM | POA: Diagnosis not present

## 2017-12-06 DIAGNOSIS — M3500 Sicca syndrome, unspecified: Secondary | ICD-10-CM | POA: Diagnosis not present

## 2017-12-06 DIAGNOSIS — Z780 Asymptomatic menopausal state: Secondary | ICD-10-CM | POA: Diagnosis not present

## 2017-12-06 DIAGNOSIS — Z5181 Encounter for therapeutic drug level monitoring: Secondary | ICD-10-CM | POA: Diagnosis not present

## 2017-12-06 DIAGNOSIS — I7300 Raynaud's syndrome without gangrene: Secondary | ICD-10-CM | POA: Diagnosis not present

## 2017-12-06 DIAGNOSIS — E782 Mixed hyperlipidemia: Secondary | ICD-10-CM | POA: Diagnosis not present

## 2017-12-07 DIAGNOSIS — Z79899 Other long term (current) drug therapy: Secondary | ICD-10-CM | POA: Diagnosis not present

## 2018-01-10 DIAGNOSIS — I7300 Raynaud's syndrome without gangrene: Secondary | ICD-10-CM | POA: Diagnosis not present

## 2018-01-10 DIAGNOSIS — M3500 Sicca syndrome, unspecified: Secondary | ICD-10-CM | POA: Diagnosis not present

## 2018-01-10 DIAGNOSIS — Z6824 Body mass index (BMI) 24.0-24.9, adult: Secondary | ICD-10-CM | POA: Diagnosis not present

## 2018-01-10 DIAGNOSIS — Z79899 Other long term (current) drug therapy: Secondary | ICD-10-CM | POA: Diagnosis not present

## 2018-01-15 DIAGNOSIS — N390 Urinary tract infection, site not specified: Secondary | ICD-10-CM | POA: Diagnosis not present

## 2018-01-15 DIAGNOSIS — R319 Hematuria, unspecified: Secondary | ICD-10-CM | POA: Diagnosis not present

## 2018-03-08 DIAGNOSIS — R300 Dysuria: Secondary | ICD-10-CM | POA: Diagnosis not present

## 2018-03-08 DIAGNOSIS — F419 Anxiety disorder, unspecified: Secondary | ICD-10-CM | POA: Diagnosis not present

## 2018-03-08 DIAGNOSIS — N3001 Acute cystitis with hematuria: Secondary | ICD-10-CM | POA: Diagnosis not present

## 2018-03-19 DIAGNOSIS — R300 Dysuria: Secondary | ICD-10-CM | POA: Diagnosis not present

## 2018-05-16 DIAGNOSIS — N3001 Acute cystitis with hematuria: Secondary | ICD-10-CM | POA: Diagnosis not present

## 2018-05-30 ENCOUNTER — Encounter: Payer: Self-pay | Admitting: Internal Medicine

## 2018-06-15 DIAGNOSIS — N390 Urinary tract infection, site not specified: Secondary | ICD-10-CM | POA: Diagnosis not present

## 2018-06-21 DIAGNOSIS — N390 Urinary tract infection, site not specified: Secondary | ICD-10-CM | POA: Diagnosis not present

## 2018-07-05 DIAGNOSIS — I10 Essential (primary) hypertension: Secondary | ICD-10-CM | POA: Diagnosis not present

## 2018-07-11 DIAGNOSIS — M3500 Sicca syndrome, unspecified: Secondary | ICD-10-CM | POA: Diagnosis not present

## 2018-07-11 DIAGNOSIS — Z6823 Body mass index (BMI) 23.0-23.9, adult: Secondary | ICD-10-CM | POA: Diagnosis not present

## 2018-07-11 DIAGNOSIS — I7300 Raynaud's syndrome without gangrene: Secondary | ICD-10-CM | POA: Diagnosis not present

## 2018-07-11 DIAGNOSIS — Z79899 Other long term (current) drug therapy: Secondary | ICD-10-CM | POA: Diagnosis not present

## 2018-07-12 DIAGNOSIS — Z170 Estrogen receptor positive status [ER+]: Secondary | ICD-10-CM | POA: Diagnosis not present

## 2018-07-12 DIAGNOSIS — C50511 Malignant neoplasm of lower-outer quadrant of right female breast: Secondary | ICD-10-CM | POA: Diagnosis not present

## 2018-07-12 DIAGNOSIS — C50111 Malignant neoplasm of central portion of right female breast: Secondary | ICD-10-CM | POA: Diagnosis not present

## 2018-07-24 DIAGNOSIS — R300 Dysuria: Secondary | ICD-10-CM | POA: Diagnosis not present

## 2018-07-24 DIAGNOSIS — N390 Urinary tract infection, site not specified: Secondary | ICD-10-CM | POA: Diagnosis not present

## 2018-10-21 DIAGNOSIS — L989 Disorder of the skin and subcutaneous tissue, unspecified: Secondary | ICD-10-CM | POA: Diagnosis not present

## 2018-10-25 DIAGNOSIS — L821 Other seborrheic keratosis: Secondary | ICD-10-CM | POA: Diagnosis not present

## 2018-10-25 DIAGNOSIS — C44529 Squamous cell carcinoma of skin of other part of trunk: Secondary | ICD-10-CM | POA: Diagnosis not present

## 2018-10-25 DIAGNOSIS — Z85828 Personal history of other malignant neoplasm of skin: Secondary | ICD-10-CM | POA: Diagnosis not present

## 2018-12-07 ENCOUNTER — Emergency Department (HOSPITAL_COMMUNITY)
Admission: EM | Admit: 2018-12-07 | Discharge: 2018-12-07 | Disposition: A | Discharge status: Home / Self Care | Payer: Medicare HMO | Attending: Emergency Medicine | Admitting: Emergency Medicine

## 2018-12-07 ENCOUNTER — Other Ambulatory Visit: Payer: Self-pay

## 2018-12-07 ENCOUNTER — Encounter (HOSPITAL_COMMUNITY): Payer: Self-pay | Admitting: *Deleted

## 2018-12-07 ENCOUNTER — Emergency Department (HOSPITAL_COMMUNITY): Payer: Medicare HMO

## 2018-12-07 DIAGNOSIS — R112 Nausea with vomiting, unspecified: Secondary | ICD-10-CM | POA: Insufficient documentation

## 2018-12-07 DIAGNOSIS — M549 Dorsalgia, unspecified: Secondary | ICD-10-CM | POA: Diagnosis present

## 2018-12-07 DIAGNOSIS — Z87891 Personal history of nicotine dependence: Secondary | ICD-10-CM | POA: Insufficient documentation

## 2018-12-07 DIAGNOSIS — Z79899 Other long term (current) drug therapy: Secondary | ICD-10-CM | POA: Diagnosis not present

## 2018-12-07 DIAGNOSIS — M3500 Sicca syndrome, unspecified: Secondary | ICD-10-CM | POA: Diagnosis not present

## 2018-12-07 DIAGNOSIS — M545 Low back pain, unspecified: Secondary | ICD-10-CM

## 2018-12-07 DIAGNOSIS — R52 Pain, unspecified: Secondary | ICD-10-CM | POA: Diagnosis not present

## 2018-12-07 DIAGNOSIS — R29810 Facial weakness: Secondary | ICD-10-CM | POA: Diagnosis not present

## 2018-12-07 DIAGNOSIS — R079 Chest pain, unspecified: Secondary | ICD-10-CM

## 2018-12-07 DIAGNOSIS — I10 Essential (primary) hypertension: Secondary | ICD-10-CM | POA: Insufficient documentation

## 2018-12-07 DIAGNOSIS — R110 Nausea: Secondary | ICD-10-CM | POA: Diagnosis not present

## 2018-12-07 DIAGNOSIS — R51 Headache: Secondary | ICD-10-CM | POA: Diagnosis not present

## 2018-12-07 LAB — BASIC METABOLIC PANEL
Anion gap: 12 (ref 5–15)
BUN: 21 mg/dL (ref 8–23)
CO2: 21 mmol/L — ABNORMAL LOW (ref 22–32)
Calcium: 8.9 mg/dL (ref 8.9–10.3)
Chloride: 103 mmol/L (ref 98–111)
Creatinine, Ser: 1.04 mg/dL — ABNORMAL HIGH (ref 0.44–1.00)
GFR calc Af Amer: 58 mL/min — ABNORMAL LOW (ref >60)
GFR calc non Af Amer: 50 mL/min — ABNORMAL LOW (ref >60)
Glucose, Bld: 140 mg/dL — ABNORMAL HIGH (ref 70–99)
Potassium: 3.9 mmol/L (ref 3.5–5.1)
Sodium: 136 mmol/L (ref 135–145)

## 2018-12-07 LAB — CBC
HCT: 39.9 % (ref 36.0–46.0)
Hemoglobin: 12.6 g/dL (ref 12.0–15.0)
MCH: 30.2 pg (ref 26.0–34.0)
MCHC: 31.6 g/dL (ref 30.0–36.0)
MCV: 95.7 fL (ref 80.0–100.0)
Platelets: 178 10*3/uL (ref 150–400)
RBC: 4.17 MIL/uL (ref 3.87–5.11)
RDW: 13.3 % (ref 11.5–15.5)
WBC: 6.7 10*3/uL (ref 4.0–10.5)
nRBC: 0 % (ref 0.0–0.2)

## 2018-12-07 LAB — URINALYSIS, ROUTINE W REFLEX MICROSCOPIC
Bacteria, UA: NONE SEEN
Bilirubin Urine: NEGATIVE
Glucose, UA: NEGATIVE mg/dL
Ketones, ur: NEGATIVE mg/dL
Leukocytes,Ua: NEGATIVE
Nitrite: NEGATIVE
Protein, ur: NEGATIVE mg/dL
Specific Gravity, Urine: 1.013 (ref 1.005–1.030)
pH: 6 (ref 5.0–8.0)

## 2018-12-07 LAB — TROPONIN I (HIGH SENSITIVITY)
Troponin I (High Sensitivity): 7 ng/L (ref <18)
Troponin I (High Sensitivity): 11 ng/L (ref <18)

## 2018-12-07 MED ORDER — SODIUM CHLORIDE 0.9% FLUSH
3.0000 mL | Freq: Once | INTRAVENOUS | Status: AC
  Administered 2018-12-07: 3 mL via INTRAVENOUS
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
## 2018-12-07 NOTE — ED Notes (Signed)
Pt discharge summary reviewed with patient. Patient verbalized understanding of discharge instructions. Pt unable to sign due to signature pad not picking up. Pt discharged.

## 2018-12-07 NOTE — ED Triage Notes (Signed)
The pt arrived by [facility] from home she woke up with a headache  She took tylenol for her headache and became nauseated and vomited,. Iv per ems

## 2018-12-07 NOTE — Discharge Instructions (Signed)
All the blood work to your heart, kidneys and electrolytes were normal today.  There is no evidence of heart attack.  You may have just overdid it yesterday.  Make sure you do not get overheated or overdo it in the next few days.  If you start having recurrent symptoms please return to the emergency room or call your doctor.

## 2018-12-07 NOTE — ED Provider Notes (Signed)
Patient's UA is within normal limits and repeat troponin is 11 with a delta change of 4.  Patient is still feeling well and feel reasonable for discharge and follow-up with PCP or return for recurrent symptoms.   Blanchie Dessert, MD 12/07/18 [phone]

## 2018-12-07 NOTE — ED Notes (Signed)
Ems gave zofran 4mg  on the way here

## 2018-12-16 DIAGNOSIS — Z79899 Other long term (current) drug therapy: Secondary | ICD-10-CM | POA: Diagnosis not present

## 2018-12-16 DIAGNOSIS — H524 Presbyopia: Secondary | ICD-10-CM | POA: Diagnosis not present

## 2019-01-08 DIAGNOSIS — I10 Essential (primary) hypertension: Secondary | ICD-10-CM | POA: Diagnosis not present

## 2019-01-08 DIAGNOSIS — M3500 Sicca syndrome, unspecified: Secondary | ICD-10-CM | POA: Diagnosis not present

## 2019-01-08 DIAGNOSIS — M810 Age-related osteoporosis without current pathological fracture: Secondary | ICD-10-CM | POA: Diagnosis not present

## 2019-01-08 DIAGNOSIS — R739 Hyperglycemia, unspecified: Secondary | ICD-10-CM | POA: Diagnosis not present

## 2019-01-08 DIAGNOSIS — Z23 Encounter for immunization: Secondary | ICD-10-CM | POA: Diagnosis not present

## 2019-01-08 DIAGNOSIS — E782 Mixed hyperlipidemia: Secondary | ICD-10-CM | POA: Diagnosis not present

## 2019-01-08 DIAGNOSIS — F419 Anxiety disorder, unspecified: Secondary | ICD-10-CM | POA: Diagnosis not present

## 2019-01-10 DIAGNOSIS — I7300 Raynaud's syndrome without gangrene: Secondary | ICD-10-CM | POA: Diagnosis not present

## 2019-01-10 DIAGNOSIS — Z79899 Other long term (current) drug therapy: Secondary | ICD-10-CM | POA: Diagnosis not present

## 2019-01-10 DIAGNOSIS — M3500 Sicca syndrome, unspecified: Secondary | ICD-10-CM | POA: Diagnosis not present

## 2019-01-10 DIAGNOSIS — Z6823 Body mass index (BMI) 23.0-23.9, adult: Secondary | ICD-10-CM | POA: Diagnosis not present

## 2019-01-24 DIAGNOSIS — Z79899 Other long term (current) drug therapy: Secondary | ICD-10-CM | POA: Diagnosis not present

## 2019-01-28 DIAGNOSIS — Z85828 Personal history of other malignant neoplasm of skin: Secondary | ICD-10-CM | POA: Diagnosis not present

## 2019-01-28 DIAGNOSIS — D1801 Hemangioma of skin and subcutaneous tissue: Secondary | ICD-10-CM | POA: Diagnosis not present

## 2019-01-28 DIAGNOSIS — L814 Other melanin hyperpigmentation: Secondary | ICD-10-CM | POA: Diagnosis not present

## 2019-01-28 DIAGNOSIS — L821 Other seborrheic keratosis: Secondary | ICD-10-CM | POA: Diagnosis not present

## 2019-03-03 DIAGNOSIS — R300 Dysuria: Secondary | ICD-10-CM | POA: Diagnosis not present

## 2019-03-03 DIAGNOSIS — N3001 Acute cystitis with hematuria: Secondary | ICD-10-CM | POA: Diagnosis not present

## 2019-05-19 DIAGNOSIS — R300 Dysuria: Secondary | ICD-10-CM | POA: Diagnosis not present

## 2019-05-22 ENCOUNTER — Ambulatory Visit: Payer: Medicare HMO | Attending: Internal Medicine

## 2019-05-22 DIAGNOSIS — Z23 Encounter for immunization: Secondary | ICD-10-CM | POA: Insufficient documentation

## 2019-05-22 NOTE — Progress Notes (Signed)
   Covid-19 Vaccination Clinic  Name:  Sheila Pugh    MRN: [id number] DOB: [birth year]  05/22/2019  Sheila Pugh was observed post Covid-19 immunization for 15 minutes without incidence. She was provided with Vaccine Information Sheet and instruction to access the V-Safe system.   Sheila Pugh was instructed to call 911 with any severe reactions post vaccine: Marland Kitchen Difficulty breathing  . Swelling of your face and throat  . A fast heartbeat  . A bad rash all over your body  . Dizziness and weakness    Immunizations Administered    Name Date Dose VIS Date Route   Pfizer COVID-19 Vaccine 05/22/2019 10:07 AM 0.3 mL 04/11/2019 Intramuscular   Manufacturer: [place]   Lot: [id number]   [place]: [id number]

## 2019-06-12 ENCOUNTER — Ambulatory Visit: Payer: Medicare HMO | Attending: Internal Medicine

## 2019-06-12 DIAGNOSIS — Z23 Encounter for immunization: Secondary | ICD-10-CM | POA: Insufficient documentation

## 2019-06-12 NOTE — Progress Notes (Signed)
   Covid-19 Vaccination Clinic  Name:  Sheila Pugh    MRN: [id number] DOB: 09-03-1935  06/12/2019  [name] was observed post Covid-19 immunization for 15 minutes without incidence. She was provided with Vaccine Information Sheet and instruction to access the V-Safe system.   [name] was instructed to call 911 with any severe reactions post vaccine: Marland Kitchen Difficulty breathing  . Swelling of your face and throat  . A fast heartbeat  . A bad rash all over your body  . Dizziness and weakness    Immunizations Administered    Name Date Dose VIS Date Route   Pfizer COVID-19 Vaccine 06/12/2019  9:54 AM 0.3 mL 04/11/2019 Intramuscular   Manufacturer: [place]   Lot: [id number]   [place]: [id number]

## 2019-07-11 DIAGNOSIS — M3500 Sicca syndrome, unspecified: Secondary | ICD-10-CM | POA: Diagnosis not present

## 2019-07-11 DIAGNOSIS — Z79899 Other long term (current) drug therapy: Secondary | ICD-10-CM | POA: Diagnosis not present

## 2019-07-11 DIAGNOSIS — I7300 Raynaud's syndrome without gangrene: Secondary | ICD-10-CM | POA: Diagnosis not present

## 2019-07-11 DIAGNOSIS — Z6823 Body mass index (BMI) 23.0-23.9, adult: Secondary | ICD-10-CM | POA: Diagnosis not present

## 2019-07-11 DIAGNOSIS — M810 Age-related osteoporosis without current pathological fracture: Secondary | ICD-10-CM | POA: Diagnosis not present

## 2019-07-18 DIAGNOSIS — C50111 Malignant neoplasm of central portion of right female breast: Secondary | ICD-10-CM | POA: Diagnosis not present

## 2019-07-18 DIAGNOSIS — Z170 Estrogen receptor positive status [ER+]: Secondary | ICD-10-CM | POA: Diagnosis not present

## 2019-07-18 DIAGNOSIS — Z79811 Long term (current) use of aromatase inhibitors: Secondary | ICD-10-CM | POA: Diagnosis not present

## 2019-07-18 DIAGNOSIS — C50119 Malignant neoplasm of central portion of unspecified female breast: Secondary | ICD-10-CM | POA: Diagnosis not present

## 2019-07-18 DIAGNOSIS — Z171 Estrogen receptor negative status [ER-]: Secondary | ICD-10-CM | POA: Diagnosis not present

## 2019-07-18 DIAGNOSIS — Z9011 Acquired absence of right breast and nipple: Secondary | ICD-10-CM | POA: Diagnosis not present

## 2019-07-28 DIAGNOSIS — Z780 Asymptomatic menopausal state: Secondary | ICD-10-CM | POA: Diagnosis not present

## 2019-07-28 DIAGNOSIS — M810 Age-related osteoporosis without current pathological fracture: Secondary | ICD-10-CM | POA: Diagnosis not present

## 2019-12-19 DIAGNOSIS — Z20828 Contact with and (suspected) exposure to other viral communicable diseases: Secondary | ICD-10-CM | POA: Diagnosis not present

## 2019-12-19 DIAGNOSIS — U071 COVID-19: Secondary | ICD-10-CM | POA: Diagnosis not present

## 2019-12-19 DIAGNOSIS — R05 Cough: Secondary | ICD-10-CM | POA: Diagnosis not present

## 2019-12-20 ENCOUNTER — Encounter (HOSPITAL_COMMUNITY): Payer: Self-pay

## 2019-12-20 ENCOUNTER — Emergency Department (HOSPITAL_COMMUNITY)
Admission: EM | Admit: 2019-12-20 | Discharge: 2019-12-20 | Disposition: A | Discharge status: Home / Self Care | Payer: Medicare HMO | Attending: Emergency Medicine | Admitting: Emergency Medicine

## 2019-12-20 ENCOUNTER — Emergency Department (HOSPITAL_COMMUNITY): Payer: Medicare HMO

## 2019-12-20 DIAGNOSIS — U071 COVID-19: Secondary | ICD-10-CM | POA: Diagnosis not present

## 2019-12-20 DIAGNOSIS — I10 Essential (primary) hypertension: Secondary | ICD-10-CM | POA: Diagnosis not present

## 2019-12-20 DIAGNOSIS — I959 Hypotension, unspecified: Secondary | ICD-10-CM | POA: Diagnosis not present

## 2019-12-20 DIAGNOSIS — R0902 Hypoxemia: Secondary | ICD-10-CM | POA: Diagnosis not present

## 2019-12-20 DIAGNOSIS — Z23 Encounter for immunization: Secondary | ICD-10-CM | POA: Insufficient documentation

## 2019-12-20 DIAGNOSIS — Z79899 Other long term (current) drug therapy: Secondary | ICD-10-CM | POA: Insufficient documentation

## 2019-12-20 DIAGNOSIS — R05 Cough: Secondary | ICD-10-CM | POA: Diagnosis not present

## 2019-12-20 DIAGNOSIS — Z87891 Personal history of nicotine dependence: Secondary | ICD-10-CM | POA: Insufficient documentation

## 2019-12-20 DIAGNOSIS — E876 Hypokalemia: Secondary | ICD-10-CM | POA: Diagnosis not present

## 2019-12-20 DIAGNOSIS — R197 Diarrhea, unspecified: Secondary | ICD-10-CM | POA: Diagnosis not present

## 2019-12-20 LAB — COMPREHENSIVE METABOLIC PANEL
ALT: 16 U/L (ref 0–44)
AST: 26 U/L (ref 15–41)
Albumin: 4.3 g/dL (ref 3.5–5.0)
Alkaline Phosphatase: 58 U/L (ref 38–126)
Anion gap: 10 (ref 5–15)
BUN: 19 mg/dL (ref 8–23)
CO2: 23 mmol/L (ref 22–32)
Calcium: 8.5 mg/dL — ABNORMAL LOW (ref 8.9–10.3)
Chloride: 104 mmol/L (ref 98–111)
Creatinine, Ser: 1.08 mg/dL — ABNORMAL HIGH (ref 0.44–1.00)
GFR calc Af Amer: 55 mL/min — ABNORMAL LOW (ref >60)
GFR calc non Af Amer: 48 mL/min — ABNORMAL LOW (ref >60)
Glucose, Bld: 131 mg/dL — ABNORMAL HIGH (ref 70–99)
Potassium: 4.4 mmol/L (ref 3.5–5.1)
Sodium: 137 mmol/L (ref 135–145)
Total Bilirubin: 0.7 mg/dL (ref 0.3–1.2)
Total Protein: 7.6 g/dL (ref 6.5–8.1)

## 2019-12-20 LAB — CBC WITH DIFFERENTIAL/PLATELET
Abs Immature Granulocytes: 0.04 10*3/uL (ref 0.00–0.07)
Basophils Absolute: 0 10*3/uL (ref 0.0–0.1)
Basophils Relative: 0 %
Eosinophils Absolute: 0 10*3/uL (ref 0.0–0.5)
Eosinophils Relative: 1 %
HCT: 44.6 % (ref 36.0–46.0)
Hemoglobin: 14 g/dL (ref 12.0–15.0)
Immature Granulocytes: 1 %
Lymphocytes Relative: 7 %
Lymphs Abs: 0.5 10*3/uL — ABNORMAL LOW (ref 0.7–4.0)
MCH: 30.5 pg (ref 26.0–34.0)
MCHC: 31.4 g/dL (ref 30.0–36.0)
MCV: 97.2 fL (ref 80.0–100.0)
Monocytes Absolute: 0.7 10*3/uL (ref 0.1–1.0)
Monocytes Relative: 9 %
Neutro Abs: 6.4 10*3/uL (ref 1.7–7.7)
Neutrophils Relative %: 82 %
Platelets: 135 10*3/uL — ABNORMAL LOW (ref 150–400)
RBC: 4.59 MIL/uL (ref 3.87–5.11)
RDW: 13.2 % (ref 11.5–15.5)
WBC: 7.6 10*3/uL (ref 4.0–10.5)
nRBC: 0 % (ref 0.0–0.2)

## 2019-12-20 LAB — LIPASE, BLOOD: Lipase: 56 U/L — ABNORMAL HIGH (ref 11–51)

## 2019-12-20 MED ORDER — ONDANSETRON HCL 4 MG PO TABS: 4.0000 mg | ORAL_TABLET | Freq: Three times a day (TID) | ORAL | 0 refills | Status: DC | PRN

## 2019-12-20 MED ORDER — ALBUTEROL SULFATE HFA 108 (90 BASE) MCG/ACT IN AERS: 2.0000 | INHALATION_SPRAY | Freq: Once | RESPIRATORY_TRACT | Status: DC | PRN

## 2019-12-20 MED ORDER — SODIUM CHLORIDE 0.9 % IV SOLN
1200.0000 mg | Freq: Once | INTRAVENOUS | Status: AC
  Administered 2019-12-20: 1200 mg via INTRAVENOUS
  Filled 2019-12-20: qty 1200

## 2019-12-20 MED ORDER — ONDANSETRON HCL 4 MG/2ML IJ SOLN
4.0000 mg | Freq: Once | INTRAMUSCULAR | Status: AC
  Administered 2019-12-20: 4 mg via INTRAVENOUS
  Filled 2019-12-20: qty 2

## 2019-12-20 MED ORDER — SODIUM CHLORIDE 0.9 % IV BOLUS
1000.0000 mL | Freq: Once | INTRAVENOUS | Status: AC
  Administered 2019-12-20: 1000 mL via INTRAVENOUS

## 2019-12-20 MED ORDER — METHYLPREDNISOLONE SODIUM SUCC 125 MG IJ SOLR: 125.0000 mg | Freq: Once | INTRAMUSCULAR | Status: DC | PRN

## 2019-12-20 MED ORDER — EPINEPHRINE 0.3 MG/0.3ML IJ SOAJ: 0.3000 mg | Freq: Once | INTRAMUSCULAR | Status: DC | PRN

## 2019-12-20 MED ORDER — PREDNISONE 10 MG PO TABS: 20.0000 mg | ORAL_TABLET | Freq: Every day | ORAL | 0 refills | Status: AC

## 2019-12-20 MED ORDER — SODIUM CHLORIDE 0.9 % IV SOLN: INTRAVENOUS | Status: DC | PRN

## 2019-12-20 MED ORDER — DIPHENHYDRAMINE HCL 50 MG/ML IJ SOLN: 50.0000 mg | Freq: Once | INTRAMUSCULAR | Status: DC | PRN

## 2019-12-20 MED ORDER — FAMOTIDINE IN NACL 20-0.9 MG/50ML-% IV SOLN: 20.0000 mg | Freq: Once | INTRAVENOUS | Status: DC | PRN

## 2019-12-20 MED ORDER — DEXAMETHASONE SODIUM PHOSPHATE 10 MG/ML IJ SOLN
6.0000 mg | Freq: Once | INTRAMUSCULAR | Status: AC
  Administered 2019-12-20: 6 mg via INTRAVENOUS
  Filled 2019-12-20: qty 1
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
## 2019-12-20 NOTE — ED Triage Notes (Signed)
Pt presents with c/o Covid + yesterday. Pt reports she has had a cough for a few days which prompted the test yesterday. Pt reports an upset stomach and diarrhea that started today. Pt reports she has had both doses of the Covid vaccine. Pt orthostatic with EMS. VS BP 120/44, HR 53, 16 R, 100% on 3L, CBG 163.

## 2019-12-20 NOTE — Discharge Instructions (Addendum)
Your bloodwork was reassuring today and did not show signs of dehydration.  We gave you IV fluids.  We also gave you IV steroids and IV monoclonal antibodies - a medication treatment that's been shown to reduce the length of covid illness in certain patients.  I prescribed you steroids to take for the next 4 days, beginning TOMORROW morning (12/21/19).  I also prescribed you zofran for nausea.  It's very important you keep drinking fluids and keep down calories.  Consider Boost, Ensure, or Smoothies if you cannot tolerate solid meals.   Finally, your blood pressure was a little low today.  I recommended that you cut your metoprolol dose in HALF for the next 2 weeks, until you are over this illness.  Take 50 mg metoprolol (half a tablet) twice per day.  Take your blood pressure twice per day and keep track of it in a diary.  You should discuss this issue with your doctor.  It's possible your blood pressure will rise again when you've recovered and are eating and drinking normally again.

## 2019-12-20 NOTE — ED Notes (Signed)
Pt displayed no sings of transfusion reaction following the 1 hr observation period. Discharge paperwork was reviewed with pt, pt unable to sign for paperwork due to pad not working.

## 2020-01-08 ENCOUNTER — Ambulatory Visit: Payer: Medicare HMO | Attending: Internal Medicine

## 2020-01-08 DIAGNOSIS — Z23 Encounter for immunization: Secondary | ICD-10-CM

## 2020-01-08 NOTE — Progress Notes (Signed)
   Covid-19 Vaccination Clinic  Name:  JANNIS ATKINS    MRN: [id number] DOB: 08/24/1935  01/08/2020  Ms. Pund was observed post Covid-19 immunization for 15 minutes without incident. She was provided with Vaccine Information Sheet and instruction to access the V-Safe system.   Ms. Bagent was instructed to call 911 with any severe reactions post vaccine: Marland Kitchen Difficulty breathing  . Swelling of face and throat  . A fast heartbeat  . A bad rash all over body  . Dizziness and weakness

## 2020-01-12 DIAGNOSIS — Z79899 Other long term (current) drug therapy: Secondary | ICD-10-CM | POA: Diagnosis not present

## 2020-01-12 DIAGNOSIS — Z6823 Body mass index (BMI) 23.0-23.9, adult: Secondary | ICD-10-CM | POA: Diagnosis not present

## 2020-01-12 DIAGNOSIS — M810 Age-related osteoporosis without current pathological fracture: Secondary | ICD-10-CM | POA: Diagnosis not present

## 2020-01-12 DIAGNOSIS — M3500 Sicca syndrome, unspecified: Secondary | ICD-10-CM | POA: Diagnosis not present

## 2020-01-12 DIAGNOSIS — I7300 Raynaud's syndrome without gangrene: Secondary | ICD-10-CM | POA: Diagnosis not present

## 2020-01-13 ENCOUNTER — Ambulatory Visit: Payer: Medicare HMO

## 2020-01-30 DIAGNOSIS — L814 Other melanin hyperpigmentation: Secondary | ICD-10-CM | POA: Diagnosis not present

## 2020-01-30 DIAGNOSIS — Z85828 Personal history of other malignant neoplasm of skin: Secondary | ICD-10-CM | POA: Diagnosis not present

## 2020-01-30 DIAGNOSIS — L821 Other seborrheic keratosis: Secondary | ICD-10-CM | POA: Diagnosis not present

## 2020-01-30 DIAGNOSIS — D485 Neoplasm of uncertain behavior of skin: Secondary | ICD-10-CM | POA: Diagnosis not present

## 2020-01-30 DIAGNOSIS — L570 Actinic keratosis: Secondary | ICD-10-CM | POA: Diagnosis not present

## 2020-02-06 DIAGNOSIS — R739 Hyperglycemia, unspecified: Secondary | ICD-10-CM | POA: Diagnosis not present

## 2020-02-06 DIAGNOSIS — E782 Mixed hyperlipidemia: Secondary | ICD-10-CM | POA: Diagnosis not present

## 2020-02-06 DIAGNOSIS — I10 Essential (primary) hypertension: Secondary | ICD-10-CM | POA: Diagnosis not present

## 2020-02-06 DIAGNOSIS — Z23 Encounter for immunization: Secondary | ICD-10-CM | POA: Diagnosis not present

## 2020-02-23 DIAGNOSIS — H524 Presbyopia: Secondary | ICD-10-CM | POA: Diagnosis not present

## 2020-02-23 DIAGNOSIS — H3530 Unspecified macular degeneration: Secondary | ICD-10-CM | POA: Diagnosis not present

## 2020-03-02 DIAGNOSIS — I10 Essential (primary) hypertension: Secondary | ICD-10-CM | POA: Diagnosis not present

## 2020-03-02 DIAGNOSIS — E782 Mixed hyperlipidemia: Secondary | ICD-10-CM | POA: Diagnosis not present

## 2020-03-02 DIAGNOSIS — Z23 Encounter for immunization: Secondary | ICD-10-CM | POA: Diagnosis not present

## 2020-03-02 DIAGNOSIS — R739 Hyperglycemia, unspecified: Secondary | ICD-10-CM | POA: Diagnosis not present

## 2020-03-11 DIAGNOSIS — H18413 Arcus senilis, bilateral: Secondary | ICD-10-CM | POA: Diagnosis not present

## 2020-03-11 DIAGNOSIS — H26492 Other secondary cataract, left eye: Secondary | ICD-10-CM | POA: Diagnosis not present

## 2020-03-11 DIAGNOSIS — H35413 Lattice degeneration of retina, bilateral: Secondary | ICD-10-CM | POA: Diagnosis not present

## 2020-03-11 DIAGNOSIS — Z961 Presence of intraocular lens: Secondary | ICD-10-CM | POA: Diagnosis not present

## 2020-03-11 DIAGNOSIS — H26493 Other secondary cataract, bilateral: Secondary | ICD-10-CM | POA: Diagnosis not present

## 2020-04-06 DIAGNOSIS — H26491 Other secondary cataract, right eye: Secondary | ICD-10-CM | POA: Diagnosis not present

## 2020-04-06 DIAGNOSIS — H26493 Other secondary cataract, bilateral: Secondary | ICD-10-CM | POA: Diagnosis not present

## 2020-05-11 DIAGNOSIS — I10 Essential (primary) hypertension: Secondary | ICD-10-CM | POA: Diagnosis not present

## 2020-05-11 DIAGNOSIS — Z1211 Encounter for screening for malignant neoplasm of colon: Secondary | ICD-10-CM | POA: Diagnosis not present

## 2020-05-11 DIAGNOSIS — G629 Polyneuropathy, unspecified: Secondary | ICD-10-CM | POA: Diagnosis not present

## 2020-05-11 DIAGNOSIS — R739 Hyperglycemia, unspecified: Secondary | ICD-10-CM | POA: Diagnosis not present

## 2020-05-11 DIAGNOSIS — E782 Mixed hyperlipidemia: Secondary | ICD-10-CM | POA: Diagnosis not present

## 2020-07-12 DIAGNOSIS — M810 Age-related osteoporosis without current pathological fracture: Secondary | ICD-10-CM | POA: Diagnosis not present

## 2020-07-12 DIAGNOSIS — M3500 Sicca syndrome, unspecified: Secondary | ICD-10-CM | POA: Diagnosis not present

## 2020-07-12 DIAGNOSIS — M150 Primary generalized (osteo)arthritis: Secondary | ICD-10-CM | POA: Diagnosis not present

## 2020-07-12 DIAGNOSIS — Z6822 Body mass index (BMI) 22.0-22.9, adult: Secondary | ICD-10-CM | POA: Diagnosis not present

## 2020-07-12 DIAGNOSIS — Z79899 Other long term (current) drug therapy: Secondary | ICD-10-CM | POA: Diagnosis not present

## 2020-07-12 DIAGNOSIS — I7300 Raynaud's syndrome without gangrene: Secondary | ICD-10-CM | POA: Diagnosis not present

## 2020-07-13 DIAGNOSIS — Z800 Family history of malignant neoplasm of digestive organs: Secondary | ICD-10-CM | POA: Diagnosis not present

## 2020-07-13 DIAGNOSIS — R194 Change in bowel habit: Secondary | ICD-10-CM | POA: Diagnosis not present

## 2020-07-13 DIAGNOSIS — Z86010 Personal history of colonic polyps: Secondary | ICD-10-CM | POA: Diagnosis not present

## 2020-07-28 DIAGNOSIS — N183 Chronic kidney disease, stage 3 unspecified: Secondary | ICD-10-CM | POA: Diagnosis not present

## 2020-07-28 DIAGNOSIS — I10 Essential (primary) hypertension: Secondary | ICD-10-CM | POA: Diagnosis not present

## 2020-07-28 DIAGNOSIS — C50911 Malignant neoplasm of unspecified site of right female breast: Secondary | ICD-10-CM | POA: Diagnosis not present

## 2020-07-28 DIAGNOSIS — M810 Age-related osteoporosis without current pathological fracture: Secondary | ICD-10-CM | POA: Diagnosis not present

## 2020-07-28 DIAGNOSIS — E782 Mixed hyperlipidemia: Secondary | ICD-10-CM | POA: Diagnosis not present

## 2020-09-10 DIAGNOSIS — C50119 Malignant neoplasm of central portion of unspecified female breast: Secondary | ICD-10-CM | POA: Diagnosis not present

## 2020-09-10 DIAGNOSIS — Z79811 Long term (current) use of aromatase inhibitors: Secondary | ICD-10-CM | POA: Diagnosis not present

## 2020-09-10 DIAGNOSIS — Z171 Estrogen receptor negative status [ER-]: Secondary | ICD-10-CM | POA: Diagnosis not present

## 2020-09-10 DIAGNOSIS — Z1231 Encounter for screening mammogram for malignant neoplasm of breast: Secondary | ICD-10-CM | POA: Diagnosis not present

## 2020-09-10 DIAGNOSIS — Z9889 Other specified postprocedural states: Secondary | ICD-10-CM | POA: Diagnosis not present

## 2020-09-10 DIAGNOSIS — N6489 Other specified disorders of breast: Secondary | ICD-10-CM | POA: Diagnosis not present

## 2020-09-10 DIAGNOSIS — C50111 Malignant neoplasm of central portion of right female breast: Secondary | ICD-10-CM | POA: Diagnosis not present

## 2020-09-10 DIAGNOSIS — Z170 Estrogen receptor positive status [ER+]: Secondary | ICD-10-CM | POA: Diagnosis not present

## 2020-09-10 DIAGNOSIS — C50411 Malignant neoplasm of upper-outer quadrant of right female breast: Secondary | ICD-10-CM | POA: Diagnosis not present

## 2020-09-30 DIAGNOSIS — Z1389 Encounter for screening for other disorder: Secondary | ICD-10-CM | POA: Diagnosis not present

## 2020-09-30 DIAGNOSIS — Z0000 Encounter for general adult medical examination without abnormal findings: Secondary | ICD-10-CM | POA: Diagnosis not present

## 2020-10-28 DIAGNOSIS — Z8371 Family history of colonic polyps: Secondary | ICD-10-CM | POA: Diagnosis not present

## 2020-10-28 DIAGNOSIS — Z800 Family history of malignant neoplasm of digestive organs: Secondary | ICD-10-CM | POA: Diagnosis not present

## 2020-10-28 DIAGNOSIS — K6289 Other specified diseases of anus and rectum: Secondary | ICD-10-CM | POA: Diagnosis not present

## 2020-10-28 DIAGNOSIS — K648 Other hemorrhoids: Secondary | ICD-10-CM | POA: Diagnosis not present

## 2020-10-28 DIAGNOSIS — Z86010 Personal history of colonic polyps: Secondary | ICD-10-CM | POA: Diagnosis not present

## 2020-10-28 DIAGNOSIS — R194 Change in bowel habit: Secondary | ICD-10-CM | POA: Diagnosis not present

## 2020-10-28 DIAGNOSIS — D123 Benign neoplasm of transverse colon: Secondary | ICD-10-CM | POA: Diagnosis not present

## 2020-11-02 DIAGNOSIS — D123 Benign neoplasm of transverse colon: Secondary | ICD-10-CM | POA: Diagnosis not present

## 2020-12-24 DIAGNOSIS — M810 Age-related osteoporosis without current pathological fracture: Secondary | ICD-10-CM | POA: Diagnosis not present

## 2020-12-24 DIAGNOSIS — E782 Mixed hyperlipidemia: Secondary | ICD-10-CM | POA: Diagnosis not present

## 2020-12-24 DIAGNOSIS — N183 Chronic kidney disease, stage 3 unspecified: Secondary | ICD-10-CM | POA: Diagnosis not present

## 2020-12-24 DIAGNOSIS — I10 Essential (primary) hypertension: Secondary | ICD-10-CM | POA: Diagnosis not present

## 2021-01-04 DIAGNOSIS — H524 Presbyopia: Secondary | ICD-10-CM | POA: Diagnosis not present

## 2021-01-04 DIAGNOSIS — I10 Essential (primary) hypertension: Secondary | ICD-10-CM | POA: Diagnosis not present

## 2021-01-04 DIAGNOSIS — H3530 Unspecified macular degeneration: Secondary | ICD-10-CM | POA: Diagnosis not present

## 2021-01-12 DIAGNOSIS — I7300 Raynaud's syndrome without gangrene: Secondary | ICD-10-CM | POA: Diagnosis not present

## 2021-01-12 DIAGNOSIS — M3500 Sicca syndrome, unspecified: Secondary | ICD-10-CM | POA: Diagnosis not present

## 2021-01-12 DIAGNOSIS — Z6823 Body mass index (BMI) 23.0-23.9, adult: Secondary | ICD-10-CM | POA: Diagnosis not present

## 2021-01-12 DIAGNOSIS — M150 Primary generalized (osteo)arthritis: Secondary | ICD-10-CM | POA: Diagnosis not present

## 2021-01-12 DIAGNOSIS — M810 Age-related osteoporosis without current pathological fracture: Secondary | ICD-10-CM | POA: Diagnosis not present

## 2021-01-12 DIAGNOSIS — Z79899 Other long term (current) drug therapy: Secondary | ICD-10-CM | POA: Diagnosis not present

## 2021-01-25 DIAGNOSIS — Z85828 Personal history of other malignant neoplasm of skin: Secondary | ICD-10-CM | POA: Diagnosis not present

## 2021-01-25 DIAGNOSIS — L814 Other melanin hyperpigmentation: Secondary | ICD-10-CM | POA: Diagnosis not present

## 2021-01-25 DIAGNOSIS — C44629 Squamous cell carcinoma of skin of left upper limb, including shoulder: Secondary | ICD-10-CM | POA: Diagnosis not present

## 2021-01-25 DIAGNOSIS — L821 Other seborrheic keratosis: Secondary | ICD-10-CM | POA: Diagnosis not present

## 2021-01-25 DIAGNOSIS — C44729 Squamous cell carcinoma of skin of left lower limb, including hip: Secondary | ICD-10-CM | POA: Diagnosis not present

## 2021-01-25 DIAGNOSIS — L570 Actinic keratosis: Secondary | ICD-10-CM | POA: Diagnosis not present

## 2021-02-05 DIAGNOSIS — N183 Chronic kidney disease, stage 3 unspecified: Secondary | ICD-10-CM | POA: Diagnosis not present

## 2021-02-05 DIAGNOSIS — I10 Essential (primary) hypertension: Secondary | ICD-10-CM | POA: Diagnosis not present

## 2021-02-05 DIAGNOSIS — M810 Age-related osteoporosis without current pathological fracture: Secondary | ICD-10-CM | POA: Diagnosis not present

## 2021-02-05 DIAGNOSIS — E782 Mixed hyperlipidemia: Secondary | ICD-10-CM | POA: Diagnosis not present

## 2021-02-11 DIAGNOSIS — Z23 Encounter for immunization: Secondary | ICD-10-CM | POA: Diagnosis not present

## 2021-02-11 DIAGNOSIS — M810 Age-related osteoporosis without current pathological fracture: Secondary | ICD-10-CM | POA: Diagnosis not present

## 2021-02-11 DIAGNOSIS — R7303 Prediabetes: Secondary | ICD-10-CM | POA: Diagnosis not present

## 2021-02-11 DIAGNOSIS — N183 Chronic kidney disease, stage 3 unspecified: Secondary | ICD-10-CM | POA: Diagnosis not present

## 2021-02-11 DIAGNOSIS — I10 Essential (primary) hypertension: Secondary | ICD-10-CM | POA: Diagnosis not present

## 2021-02-11 DIAGNOSIS — M3500 Sicca syndrome, unspecified: Secondary | ICD-10-CM | POA: Diagnosis not present

## 2021-02-11 DIAGNOSIS — E782 Mixed hyperlipidemia: Secondary | ICD-10-CM | POA: Diagnosis not present

## 2021-02-14 DIAGNOSIS — L03116 Cellulitis of left lower limb: Secondary | ICD-10-CM | POA: Diagnosis not present

## 2021-02-14 DIAGNOSIS — R7303 Prediabetes: Secondary | ICD-10-CM | POA: Diagnosis not present

## 2021-02-14 DIAGNOSIS — E782 Mixed hyperlipidemia: Secondary | ICD-10-CM | POA: Diagnosis not present

## 2021-02-14 DIAGNOSIS — N183 Chronic kidney disease, stage 3 unspecified: Secondary | ICD-10-CM | POA: Diagnosis not present

## 2021-02-14 DIAGNOSIS — I10 Essential (primary) hypertension: Secondary | ICD-10-CM | POA: Diagnosis not present

## 2021-02-18 DIAGNOSIS — S81812A Laceration without foreign body, left lower leg, initial encounter: Secondary | ICD-10-CM | POA: Diagnosis not present

## 2021-03-04 DIAGNOSIS — Z79899 Other long term (current) drug therapy: Secondary | ICD-10-CM | POA: Diagnosis not present

## 2021-06-20 DIAGNOSIS — T148XXA Other injury of unspecified body region, initial encounter: Secondary | ICD-10-CM | POA: Diagnosis not present

## 2021-06-20 DIAGNOSIS — L819 Disorder of pigmentation, unspecified: Secondary | ICD-10-CM | POA: Diagnosis not present

## 2021-06-28 ENCOUNTER — Emergency Department (HOSPITAL_COMMUNITY)
Admission: EM | Admit: 2021-06-28 | Discharge: 2021-06-28 | Disposition: A | Discharge status: Home / Self Care | Payer: Medicare HMO | Attending: Emergency Medicine | Admitting: Emergency Medicine

## 2021-06-28 ENCOUNTER — Emergency Department (HOSPITAL_COMMUNITY): Payer: Medicare HMO

## 2021-06-28 ENCOUNTER — Encounter (HOSPITAL_COMMUNITY): Payer: Self-pay

## 2021-06-28 DIAGNOSIS — R1084 Generalized abdominal pain: Secondary | ICD-10-CM | POA: Diagnosis not present

## 2021-06-28 DIAGNOSIS — R1013 Epigastric pain: Secondary | ICD-10-CM | POA: Diagnosis present

## 2021-06-28 DIAGNOSIS — R109 Unspecified abdominal pain: Secondary | ICD-10-CM | POA: Diagnosis not present

## 2021-06-28 DIAGNOSIS — R110 Nausea: Secondary | ICD-10-CM | POA: Diagnosis not present

## 2021-06-28 DIAGNOSIS — I10 Essential (primary) hypertension: Secondary | ICD-10-CM | POA: Insufficient documentation

## 2021-06-28 DIAGNOSIS — I700 Atherosclerosis of aorta: Secondary | ICD-10-CM | POA: Diagnosis not present

## 2021-06-28 DIAGNOSIS — Z79899 Other long term (current) drug therapy: Secondary | ICD-10-CM | POA: Insufficient documentation

## 2021-06-28 HISTORY — DX: Sjogren syndrome, unspecified: M35.00

## 2021-06-28 LAB — CBC WITH DIFFERENTIAL/PLATELET
Abs Immature Granulocytes: 0.03 10*3/uL (ref 0.00–0.07)
Basophils Absolute: 0 10*3/uL (ref 0.0–0.1)
Basophils Relative: 0 %
Eosinophils Absolute: 0 10*3/uL (ref 0.0–0.5)
Eosinophils Relative: 1 %
HCT: 42.5 % (ref 36.0–46.0)
Hemoglobin: 13.6 g/dL (ref 12.0–15.0)
Immature Granulocytes: 1 %
Lymphocytes Relative: 5 %
Lymphs Abs: 0.3 10*3/uL — ABNORMAL LOW (ref 0.7–4.0)
MCH: 30.8 pg (ref 26.0–34.0)
MCHC: 32 g/dL (ref 30.0–36.0)
MCV: 96.4 fL (ref 80.0–100.0)
Monocytes Absolute: 0.2 10*3/uL (ref 0.1–1.0)
Monocytes Relative: 4 %
Neutro Abs: 5.5 10*3/uL (ref 1.7–7.7)
Neutrophils Relative %: 89 %
Platelets: 187 10*3/uL (ref 150–400)
RBC: 4.41 MIL/uL (ref 3.87–5.11)
RDW: 13.6 % (ref 11.5–15.5)
WBC: 6.1 10*3/uL (ref 4.0–10.5)
nRBC: 0 % (ref 0.0–0.2)

## 2021-06-28 LAB — COMPREHENSIVE METABOLIC PANEL
ALT: 14 U/L (ref 0–44)
AST: 24 U/L (ref 15–41)
Albumin: 4 g/dL (ref 3.5–5.0)
Alkaline Phosphatase: 54 U/L (ref 38–126)
Anion gap: 3 — ABNORMAL LOW (ref 5–15)
BUN: 16 mg/dL (ref 8–23)
CO2: 23 mmol/L (ref 22–32)
Calcium: 8.7 mg/dL — ABNORMAL LOW (ref 8.9–10.3)
Chloride: 107 mmol/L (ref 98–111)
Creatinine, Ser: 0.83 mg/dL (ref 0.44–1.00)
GFR, Estimated: >60 mL/min (ref >60)
Glucose, Bld: 102 mg/dL — ABNORMAL HIGH (ref 70–99)
Potassium: 4.2 mmol/L (ref 3.5–5.1)
Sodium: 133 mmol/L — ABNORMAL LOW (ref 135–145)
Total Bilirubin: 0.6 mg/dL (ref 0.3–1.2)
Total Protein: 7.7 g/dL (ref 6.5–8.1)

## 2021-06-28 LAB — LIPASE, BLOOD: Lipase: 35 U/L (ref 11–51)

## 2021-06-28 MED ORDER — ONDANSETRON HCL 4 MG PO TABS: 4.0000 mg | ORAL_TABLET | Freq: Three times a day (TID) | ORAL | 0 refills | Status: DC | PRN

## 2021-06-28 MED ORDER — ONDANSETRON HCL 4 MG/2ML IJ SOLN
4.0000 mg | Freq: Once | INTRAMUSCULAR | Status: AC
  Administered 2021-06-28: 4 mg via INTRAVENOUS
  Filled 2021-06-28: qty 2

## 2021-06-28 MED ORDER — IOHEXOL 300 MG/ML  SOLN
100.0000 mL | Freq: Once | INTRAMUSCULAR | Status: AC | PRN
  Administered 2021-06-28: 80 mL via INTRAVENOUS

## 2021-06-28 NOTE — ED Notes (Signed)
Pt NAD, a/ox4. Pt verbalizes understanding of all DC and f/u instructions. All questions answered. Pt walks with steady gait to lobby at DC.  ? ?

## 2021-06-28 NOTE — Discharge Instructions (Addendum)
Please return to the ED with any new symptoms such as fevers, uncontrollable abdominal pain Please pick up the prescription of Zofran sent in for you Please follow-up with your primary care doctor this week.  The radiologist recommends having an MRI of your pancreas done as an outpatient. Please read over the attached informational guide regarding abdominal pain Please begin taking MiraLAX at home to alleviate your constipation Please begin taking Metamucil for fiber supplementation as well as drinking plenty of water

## 2021-06-28 NOTE — ED Provider Notes (Signed)
[place] DEPT Provider Note   CSN: [id number] Arrival date & time: 06/28/21  1736     History  Chief Complaint  Patient presents with   Abdominal Pain    With back pain    Sheila Pugh is a 86 y.o. female with medical history of Sjogren's disease, colonic polyps, anxiety, hypertension, hyperlipidemia.  Patient presents to ED for evaluation of abdominal pain.  Patient states abdominal pain began yesterday at 7 PM.  She states that the pain is located epigastrically but continues that she gets pain all over her abdomen.  Patient reports that the pain comes and goes, describes as a pressure.  Patient denies attempting to alleviate symptoms utilizing over-the-counter medication.  Patient states that she urinated at 4 PM today.  Patient reports she has not had a bowel movement since Sunday.  Patient is endorsing abdominal pain, bloating and headache.  Patient denies chest pain, blurred vision, diarrhea, nausea, vomiting, fevers.   Abdominal Pain Associated symptoms: no chest pain, no chills, no diarrhea, no fever, no nausea, no shortness of breath and no vomiting       Home Medications Prior to Admission medications   Medication Sig Start Date End Date Taking? Authorizing Provider  ondansetron (ZOFRAN) 4 MG tablet Take 1 tablet (4 mg total) by mouth every 8 (eight) hours as needed for nausea or vomiting. 06/28/21  Yes Azucena Cecil, PA-C  alendronate (FOSAMAX) 70 MG tablet Take 70 mg by mouth every Monday. 11/03/18   [provider]  ALPRAZolam Duanne Moron) 0.25 MG tablet Take 1 tablet (0.25 mg total) by mouth at bedtime as needed for sleep. sleep Patient not taking: Reported on 12/07/2018 09/01/15   Domenic Polite, MD  amLODipine (NORVASC) 5 MG tablet Take 5 mg by mouth daily. 10/14/18   [provider]  hydroxychloroquine (PLAQUENIL) 200 MG tablet Take 200 mg by mouth daily.     [provider]  letrozole (FEMARA) 2.5 MG tablet  Take 2.5 mg by mouth daily.    [provider]  metoprolol (LOPRESSOR) 100 MG tablet Take 1 tablet (100 mg total) by mouth 2 (two) times daily. 09/01/15   Domenic Polite, MD  ramipril (ALTACE) 5 MG capsule Take 15 mg by mouth daily. 12/05/18   [provider]      Allergies    Sulfamethoxazole    Review of Systems   Review of Systems  Constitutional:  Negative for chills and fever.  Eyes:  Negative for visual disturbance.  Respiratory:  Negative for shortness of breath.   Cardiovascular:  Negative for chest pain.  Gastrointestinal:  Positive for abdominal pain. Negative for diarrhea, nausea and vomiting.       Bloating  Neurological:  Positive for headaches.  All other systems reviewed and are negative.  Physical Exam Updated Vital Signs BP 116/67    Pulse 65    Temp 98.6 F (37 C) (Oral)    Resp 19    Ht 5' (1.524 m)    Wt 52.2 kg    SpO2 93%    BMI 22.46 kg/m  Physical Exam Vitals and nursing note reviewed.  Constitutional:      General: She is not in acute distress.    Appearance: She is not ill-appearing, toxic-appearing or diaphoretic.  HENT:     Head: Normocephalic and atraumatic.     Nose: Nose normal.     Mouth/Throat:     Mouth: Mucous membranes are moist.  Eyes:  Extraocular Movements: Extraocular movements intact.     Conjunctiva/sclera: Conjunctivae normal.     Pupils: Pupils are equal, round, and reactive to light.  Cardiovascular:     Rate and Rhythm: Normal rate and regular rhythm.  Pulmonary:     Effort: Pulmonary effort is normal.     Breath sounds: Normal breath sounds. No wheezing or rales.  Abdominal:     General: Abdomen is flat. Bowel sounds are normal. There is no distension.     Palpations: Abdomen is soft.     Tenderness: There is no abdominal tenderness. There is no right CVA tenderness or left CVA tenderness.  Musculoskeletal:     Cervical back: Normal range of motion and neck supple. No tenderness.  Skin:    General: Skin  is warm and dry.     Capillary Refill: Capillary refill takes less than 2 seconds.  Neurological:     Mental Status: She is alert and oriented to person, place, and time.    ED Results / Procedures / Treatments   Labs (all labs ordered are listed, but only abnormal results are displayed) Labs Reviewed  CBC WITH DIFFERENTIAL/PLATELET - Abnormal; Notable for the following components:      Result Value   Lymphs Abs 0.3 (*)    All other components within normal limits  COMPREHENSIVE METABOLIC PANEL - Abnormal; Notable for the following components:   Sodium 133 (*)    Glucose, Bld 102 (*)    Calcium 8.7 (*)    Anion gap 3 (*)    All other components within normal limits  LIPASE, BLOOD  URINALYSIS, ROUTINE W REFLEX MICROSCOPIC    EKG None  Radiology CT ABDOMEN PELVIS W CONTRAST  Result Date: 06/28/2021 CLINICAL DATA:  Epigastric and upper abdominal pain, nausea EXAM: CT ABDOMEN AND PELVIS WITH CONTRAST TECHNIQUE: Multidetector CT imaging of the abdomen and pelvis was performed using the standard protocol following bolus administration of intravenous contrast. RADIATION DOSE REDUCTION: This exam was performed according to the departmental dose-optimization program which includes automated exposure control, adjustment of the mA and/or kV according to patient size and/or use of iterative reconstruction technique. CONTRAST:  76mL OMNIPAQUE IOHEXOL 300 MG/ML  SOLN COMPARISON:  06/28/2021 FINDINGS: Lower chest: No acute pleural or parenchymal lung disease. Hepatobiliary: No focal liver abnormality is seen. No gallstones, gallbladder wall thickening, or biliary dilatation. Pancreas: Multiple small hypodense lesions are seen throughout the pancreas, largest measuring 1 cm within the uncinate process and pancreatic head. There is an indeterminate cystic structure arising from the dorsal superior aspect of the junction of the pancreatic body and tail, measuring 3.4 x 3.3 x 2.3 cm. No evidence of  internal or peripheral enhancement. No acute inflammatory changes in the peripancreatic fat. Spleen: Normal in size without focal abnormality. Adrenals/Urinary Tract: Nonspecific right adrenal nodule measures 1.1 cm within the medial limb of the right adrenal gland, measuring 70 Hounsfield units. The left adrenals unremarkable. The kidneys enhance normally and symmetrically. No urinary tract calculi. The bladder is unremarkable. Stomach/Bowel: No bowel obstruction or ileus. Mild retained stool throughout the colon. Normal appendix right lower quadrant. No bowel wall thickening or inflammatory change. Small hiatal hernia. Vascular/Lymphatic: Aortic atherosclerosis. No enlarged abdominal or pelvic lymph nodes. Reproductive: Status post hysterectomy. No adnexal masses. Other: No free fluid or free intraperitoneal gas. No abdominal wall hernia. Musculoskeletal: No acute or destructive bony lesions. Reconstructed images demonstrate no additional findings. IMPRESSION: 1. Simple appearing cystic structure arising from the dorsal superior margin at the  junction of the pancreatic body and tail. Differential diagnosis would include pseudocyst from previous bouts of pancreatitis versus cystic neoplasm. No inflammatory changes to suggest acute pancreatitis. When the patient is clinically stable and able to follow directions and hold their breath (preferably as an outpatient) further evaluation with dedicated abdominal MRI should be considered. 2. Other nonspecific cystic lesions throughout the pancreatic parenchyma, measuring up to 1 cm in size, likely representing multiple IPMNs. This could also be followed by subsequent MRI. 3. Small hiatal hernia. 4.  Aortic Atherosclerosis (ICD10-I70.0). 5. 1.1 cm right adrenal mass, probable benign adenoma. Recommend 1 year follow up adrenal washout CT. If stable for > 1 year, no further f/u imaging. JACR 2017 Aug; 14(8):1038-44, JCAT 2016 Mar-Apr; 40(2):194-200, Urol J 2006 Spring;  3(2):71-4. Electronically Signed   By: Randa Ngo M.D.   On: 06/28/2021 20:25   DG Abd 2 Views  Result Date: 06/28/2021 CLINICAL DATA:  Abdominal pain. EXAM: ABDOMEN - 2 VIEW COMPARISON:  None. FINDINGS: There is large amount of stool throughout the colon with no bowel dilatation or evidence of obstruction. No free air. Degenerative changes of the osseous pathology. IMPRESSION: Constipation. No bowel obstruction. Electronically Signed   By: Anner Crete M.D.   On: 06/28/2021 19:17    Procedures Procedures   Medications Ordered in ED Medications  ondansetron (ZOFRAN) injection 4 mg (4 mg Intravenous Given 06/28/21 1841)  iohexol (OMNIPAQUE) 300 MG/ML solution 100 mL (80 mLs Intravenous Contrast Given 06/28/21 1950)    ED Course/ Medical Decision Making/ A&P                           Medical Decision Making Amount and/or Complexity of Data Reviewed Labs: ordered. Radiology: ordered.  Risk Prescription drug management.   86 year old female with history of Sjogren's disease presents to ED for evaluation of abdominal pain.  On examination, the patient is nontachycardic, afebrile, soft compressible abdomen, alert and oriented x4. The patient does not endorse any tenderness to palpation when her abdomen is assessed.  There is no distention, fullness of the abdomen.  All 4 quadrants are soft and compressible.  There is no ecchymosis noted around the umbilicus or to the patient's flanks.  The patient denies any history of A-fib, being anticoagulated.   Patient worked up utilizing following labs and imaging studies interpreted by me personally: - Lipase unremarkable - CMP is unremarkable - CBC unremarkable - Plain film imaging of the abdomen shows constipation - CT abdomen pelvis with contrast shows small hiatal hernia, appearance of pancreatic pseudocyst.  Radiologist recommends MRI follow-up as an outpatient.  Patient nausea treated with 4 mg Zofran, she states after administration  of the medication she felt much better.  At this time, I feel the patient is stable for discharge.  I have advised the patient to follow-up with her PCP as an outpatient for further imaging studies recommended by radiology.  I have gone over the results of the patient's studies with her and she voiced understanding.  The patient had all of her questions answered to her satisfaction.  The patient will be discharged with return precautions.  The patient voiced understanding of these return precautions.  The patient and her case was discussed with my attending Dr. Tyrone Nine who is in agreement with the current plan.  Patient is stable at this time for discharge.   Final Clinical Impression(s) / ED Diagnoses Final diagnoses:  Generalized abdominal pain    Rx /  DC Orders ED Discharge Orders          Ordered    ondansetron (ZOFRAN) 4 MG tablet  Every 8 hours PRN        06/28/21 2058              Azucena Cecil, PA-C 06/28/21 2100    Deno Etienne, DO 06/28/21 2107

## 2021-06-28 NOTE — ED Triage Notes (Signed)
Pt a/ox4, BIBA from home for ABD pain starting last noc suddenly. Woke up this am, began having 8/10 upper quadrant/epigastric pain that wraps around to back and hips. +nausea, denies v/d, constipation, GU symptoms. ABD and flank soft nontender

## 2021-06-28 NOTE — ED Notes (Signed)
Patient transported to CT 

## 2021-07-05 DIAGNOSIS — C44729 Squamous cell carcinoma of skin of left lower limb, including hip: Secondary | ICD-10-CM | POA: Diagnosis not present

## 2021-07-05 DIAGNOSIS — D489 Neoplasm of uncertain behavior, unspecified: Secondary | ICD-10-CM | POA: Diagnosis not present

## 2021-07-05 DIAGNOSIS — Z85828 Personal history of other malignant neoplasm of skin: Secondary | ICD-10-CM | POA: Diagnosis not present

## 2021-07-12 DIAGNOSIS — M3500 Sicca syndrome, unspecified: Secondary | ICD-10-CM | POA: Diagnosis not present

## 2021-07-12 DIAGNOSIS — M810 Age-related osteoporosis without current pathological fracture: Secondary | ICD-10-CM | POA: Diagnosis not present

## 2021-07-12 DIAGNOSIS — R2689 Other abnormalities of gait and mobility: Secondary | ICD-10-CM | POA: Diagnosis not present

## 2021-07-12 DIAGNOSIS — M1991 Primary osteoarthritis, unspecified site: Secondary | ICD-10-CM | POA: Diagnosis not present

## 2021-07-12 DIAGNOSIS — I7300 Raynaud's syndrome without gangrene: Secondary | ICD-10-CM | POA: Diagnosis not present

## 2021-07-12 DIAGNOSIS — Z79899 Other long term (current) drug therapy: Secondary | ICD-10-CM | POA: Diagnosis not present

## 2021-07-12 DIAGNOSIS — Z6822 Body mass index (BMI) 22.0-22.9, adult: Secondary | ICD-10-CM | POA: Diagnosis not present

## 2021-07-15 DIAGNOSIS — K8689 Other specified diseases of pancreas: Secondary | ICD-10-CM | POA: Diagnosis not present

## 2021-07-15 DIAGNOSIS — E278 Other specified disorders of adrenal gland: Secondary | ICD-10-CM | POA: Diagnosis not present

## 2021-07-20 ENCOUNTER — Other Ambulatory Visit: Payer: Self-pay | Admitting: Family Medicine

## 2021-07-20 DIAGNOSIS — K8689 Other specified diseases of pancreas: Secondary | ICD-10-CM

## 2021-08-04 DIAGNOSIS — Z85828 Personal history of other malignant neoplasm of skin: Secondary | ICD-10-CM | POA: Diagnosis not present

## 2021-08-04 DIAGNOSIS — C44729 Squamous cell carcinoma of skin of left lower limb, including hip: Secondary | ICD-10-CM | POA: Diagnosis not present

## 2021-08-09 ENCOUNTER — Ambulatory Visit
Admission: RE | Admit: 2021-08-09 | Discharge: 2021-08-09 | Disposition: A | Discharge status: Home / Self Care | Payer: Medicare HMO | Source: Ambulatory Visit | Attending: Family Medicine | Admitting: Family Medicine

## 2021-08-09 DIAGNOSIS — K8689 Other specified diseases of pancreas: Secondary | ICD-10-CM

## 2021-08-09 DIAGNOSIS — K862 Cyst of pancreas: Secondary | ICD-10-CM | POA: Diagnosis not present

## 2021-08-09 DIAGNOSIS — K7689 Other specified diseases of liver: Secondary | ICD-10-CM | POA: Diagnosis not present

## 2021-08-09 MED ORDER — GADOBENATE DIMEGLUMINE 529 MG/ML IV SOLN
10.0000 mL | Freq: Once | INTRAVENOUS | Status: AC | PRN
  Administered 2021-08-09: 10 mL via INTRAVENOUS

## 2021-08-30 DIAGNOSIS — E782 Mixed hyperlipidemia: Secondary | ICD-10-CM | POA: Diagnosis not present

## 2021-08-30 DIAGNOSIS — N183 Chronic kidney disease, stage 3 unspecified: Secondary | ICD-10-CM | POA: Diagnosis not present

## 2021-08-30 DIAGNOSIS — I10 Essential (primary) hypertension: Secondary | ICD-10-CM | POA: Diagnosis not present

## 2021-08-30 DIAGNOSIS — M810 Age-related osteoporosis without current pathological fracture: Secondary | ICD-10-CM | POA: Diagnosis not present

## 2021-09-13 DIAGNOSIS — M8589 Other specified disorders of bone density and structure, multiple sites: Secondary | ICD-10-CM | POA: Diagnosis not present

## 2021-09-13 DIAGNOSIS — Z780 Asymptomatic menopausal state: Secondary | ICD-10-CM | POA: Diagnosis not present

## 2021-09-14 DIAGNOSIS — C50119 Malignant neoplasm of central portion of unspecified female breast: Secondary | ICD-10-CM | POA: Diagnosis not present

## 2021-09-14 DIAGNOSIS — Z853 Personal history of malignant neoplasm of breast: Secondary | ICD-10-CM | POA: Diagnosis not present

## 2021-09-14 DIAGNOSIS — Z9889 Other specified postprocedural states: Secondary | ICD-10-CM | POA: Diagnosis not present

## 2021-09-14 DIAGNOSIS — Z1231 Encounter for screening mammogram for malignant neoplasm of breast: Secondary | ICD-10-CM | POA: Diagnosis not present

## 2021-10-03 DIAGNOSIS — R7303 Prediabetes: Secondary | ICD-10-CM | POA: Diagnosis not present

## 2021-10-03 DIAGNOSIS — M810 Age-related osteoporosis without current pathological fracture: Secondary | ICD-10-CM | POA: Diagnosis not present

## 2021-10-03 DIAGNOSIS — E278 Other specified disorders of adrenal gland: Secondary | ICD-10-CM | POA: Diagnosis not present

## 2021-10-03 DIAGNOSIS — Z0000 Encounter for general adult medical examination without abnormal findings: Secondary | ICD-10-CM | POA: Diagnosis not present

## 2021-10-03 DIAGNOSIS — R2689 Other abnormalities of gait and mobility: Secondary | ICD-10-CM | POA: Diagnosis not present

## 2021-10-03 DIAGNOSIS — I10 Essential (primary) hypertension: Secondary | ICD-10-CM | POA: Diagnosis not present

## 2021-10-03 DIAGNOSIS — E782 Mixed hyperlipidemia: Secondary | ICD-10-CM | POA: Diagnosis not present

## 2021-10-03 DIAGNOSIS — M3500 Sicca syndrome, unspecified: Secondary | ICD-10-CM | POA: Diagnosis not present

## 2021-12-31 DIAGNOSIS — H6993 Unspecified Eustachian tube disorder, bilateral: Secondary | ICD-10-CM | POA: Diagnosis not present

## 2021-12-31 DIAGNOSIS — R42 Dizziness and giddiness: Secondary | ICD-10-CM | POA: Diagnosis not present

## 2021-12-31 DIAGNOSIS — H04123 Dry eye syndrome of bilateral lacrimal glands: Secondary | ICD-10-CM | POA: Diagnosis not present

## 2022-01-05 DIAGNOSIS — R2681 Unsteadiness on feet: Secondary | ICD-10-CM | POA: Diagnosis not present

## 2022-01-05 DIAGNOSIS — R262 Difficulty in walking, not elsewhere classified: Secondary | ICD-10-CM | POA: Diagnosis not present

## 2022-01-05 DIAGNOSIS — M6281 Muscle weakness (generalized): Secondary | ICD-10-CM | POA: Diagnosis not present

## 2022-01-09 DIAGNOSIS — R2681 Unsteadiness on feet: Secondary | ICD-10-CM | POA: Diagnosis not present

## 2022-01-09 DIAGNOSIS — R262 Difficulty in walking, not elsewhere classified: Secondary | ICD-10-CM | POA: Diagnosis not present

## 2022-01-09 DIAGNOSIS — M6281 Muscle weakness (generalized): Secondary | ICD-10-CM | POA: Diagnosis not present

## 2022-01-12 DIAGNOSIS — M6281 Muscle weakness (generalized): Secondary | ICD-10-CM | POA: Diagnosis not present

## 2022-01-12 DIAGNOSIS — R2681 Unsteadiness on feet: Secondary | ICD-10-CM | POA: Diagnosis not present

## 2022-01-12 DIAGNOSIS — R262 Difficulty in walking, not elsewhere classified: Secondary | ICD-10-CM | POA: Diagnosis not present

## 2022-01-17 DIAGNOSIS — M1991 Primary osteoarthritis, unspecified site: Secondary | ICD-10-CM | POA: Diagnosis not present

## 2022-01-17 DIAGNOSIS — M810 Age-related osteoporosis without current pathological fracture: Secondary | ICD-10-CM | POA: Diagnosis not present

## 2022-01-17 DIAGNOSIS — M6281 Muscle weakness (generalized): Secondary | ICD-10-CM | POA: Diagnosis not present

## 2022-01-17 DIAGNOSIS — R2681 Unsteadiness on feet: Secondary | ICD-10-CM | POA: Diagnosis not present

## 2022-01-17 DIAGNOSIS — M3500 Sicca syndrome, unspecified: Secondary | ICD-10-CM | POA: Diagnosis not present

## 2022-01-17 DIAGNOSIS — Z79899 Other long term (current) drug therapy: Secondary | ICD-10-CM | POA: Diagnosis not present

## 2022-01-17 DIAGNOSIS — I7300 Raynaud's syndrome without gangrene: Secondary | ICD-10-CM | POA: Diagnosis not present

## 2022-01-17 DIAGNOSIS — Z6821 Body mass index (BMI) 21.0-21.9, adult: Secondary | ICD-10-CM | POA: Diagnosis not present

## 2022-01-17 DIAGNOSIS — M3501 Sicca syndrome with keratoconjunctivitis: Secondary | ICD-10-CM | POA: Diagnosis not present

## 2022-01-17 DIAGNOSIS — R262 Difficulty in walking, not elsewhere classified: Secondary | ICD-10-CM | POA: Diagnosis not present

## 2022-01-24 DIAGNOSIS — R2681 Unsteadiness on feet: Secondary | ICD-10-CM | POA: Diagnosis not present

## 2022-01-24 DIAGNOSIS — R262 Difficulty in walking, not elsewhere classified: Secondary | ICD-10-CM | POA: Diagnosis not present

## 2022-01-24 DIAGNOSIS — M6281 Muscle weakness (generalized): Secondary | ICD-10-CM | POA: Diagnosis not present

## 2022-01-26 DIAGNOSIS — R262 Difficulty in walking, not elsewhere classified: Secondary | ICD-10-CM | POA: Diagnosis not present

## 2022-01-26 DIAGNOSIS — R2681 Unsteadiness on feet: Secondary | ICD-10-CM | POA: Diagnosis not present

## 2022-01-26 DIAGNOSIS — M6281 Muscle weakness (generalized): Secondary | ICD-10-CM | POA: Diagnosis not present

## 2022-01-28 DIAGNOSIS — N390 Urinary tract infection, site not specified: Secondary | ICD-10-CM | POA: Diagnosis not present

## 2022-01-28 DIAGNOSIS — R319 Hematuria, unspecified: Secondary | ICD-10-CM | POA: Diagnosis not present

## 2022-01-31 DIAGNOSIS — R2681 Unsteadiness on feet: Secondary | ICD-10-CM | POA: Diagnosis not present

## 2022-01-31 DIAGNOSIS — M6281 Muscle weakness (generalized): Secondary | ICD-10-CM | POA: Diagnosis not present

## 2022-01-31 DIAGNOSIS — R262 Difficulty in walking, not elsewhere classified: Secondary | ICD-10-CM | POA: Diagnosis not present

## 2022-02-02 DIAGNOSIS — M6281 Muscle weakness (generalized): Secondary | ICD-10-CM | POA: Diagnosis not present

## 2022-02-02 DIAGNOSIS — R2681 Unsteadiness on feet: Secondary | ICD-10-CM | POA: Diagnosis not present

## 2022-02-02 DIAGNOSIS — R262 Difficulty in walking, not elsewhere classified: Secondary | ICD-10-CM | POA: Diagnosis not present

## 2022-02-06 DIAGNOSIS — L814 Other melanin hyperpigmentation: Secondary | ICD-10-CM | POA: Diagnosis not present

## 2022-02-06 DIAGNOSIS — Z85828 Personal history of other malignant neoplasm of skin: Secondary | ICD-10-CM | POA: Diagnosis not present

## 2022-02-06 DIAGNOSIS — L821 Other seborrheic keratosis: Secondary | ICD-10-CM | POA: Diagnosis not present

## 2022-02-06 DIAGNOSIS — D692 Other nonthrombocytopenic purpura: Secondary | ICD-10-CM | POA: Diagnosis not present

## 2022-02-06 DIAGNOSIS — D1801 Hemangioma of skin and subcutaneous tissue: Secondary | ICD-10-CM | POA: Diagnosis not present

## 2022-02-06 DIAGNOSIS — D225 Melanocytic nevi of trunk: Secondary | ICD-10-CM | POA: Diagnosis not present

## 2022-02-06 DIAGNOSIS — D0439 Carcinoma in situ of skin of other parts of face: Secondary | ICD-10-CM | POA: Diagnosis not present

## 2022-02-06 DIAGNOSIS — B078 Other viral warts: Secondary | ICD-10-CM | POA: Diagnosis not present

## 2022-02-06 DIAGNOSIS — Q825 Congenital non-neoplastic nevus: Secondary | ICD-10-CM | POA: Diagnosis not present

## 2022-02-07 DIAGNOSIS — M6281 Muscle weakness (generalized): Secondary | ICD-10-CM | POA: Diagnosis not present

## 2022-02-07 DIAGNOSIS — R262 Difficulty in walking, not elsewhere classified: Secondary | ICD-10-CM | POA: Diagnosis not present

## 2022-02-07 DIAGNOSIS — R2681 Unsteadiness on feet: Secondary | ICD-10-CM | POA: Diagnosis not present

## 2022-02-09 DIAGNOSIS — R262 Difficulty in walking, not elsewhere classified: Secondary | ICD-10-CM | POA: Diagnosis not present

## 2022-02-09 DIAGNOSIS — R2681 Unsteadiness on feet: Secondary | ICD-10-CM | POA: Diagnosis not present

## 2022-02-09 DIAGNOSIS — M6281 Muscle weakness (generalized): Secondary | ICD-10-CM | POA: Diagnosis not present

## 2022-02-14 DIAGNOSIS — R262 Difficulty in walking, not elsewhere classified: Secondary | ICD-10-CM | POA: Diagnosis not present

## 2022-02-14 DIAGNOSIS — R2681 Unsteadiness on feet: Secondary | ICD-10-CM | POA: Diagnosis not present

## 2022-02-14 DIAGNOSIS — M6281 Muscle weakness (generalized): Secondary | ICD-10-CM | POA: Diagnosis not present

## 2022-02-16 DIAGNOSIS — R2681 Unsteadiness on feet: Secondary | ICD-10-CM | POA: Diagnosis not present

## 2022-02-16 DIAGNOSIS — R262 Difficulty in walking, not elsewhere classified: Secondary | ICD-10-CM | POA: Diagnosis not present

## 2022-02-16 DIAGNOSIS — M6281 Muscle weakness (generalized): Secondary | ICD-10-CM | POA: Diagnosis not present

## 2022-02-21 DIAGNOSIS — R262 Difficulty in walking, not elsewhere classified: Secondary | ICD-10-CM | POA: Diagnosis not present

## 2022-02-21 DIAGNOSIS — M6281 Muscle weakness (generalized): Secondary | ICD-10-CM | POA: Diagnosis not present

## 2022-02-21 DIAGNOSIS — R2681 Unsteadiness on feet: Secondary | ICD-10-CM | POA: Diagnosis not present

## 2022-02-28 DIAGNOSIS — R2681 Unsteadiness on feet: Secondary | ICD-10-CM | POA: Diagnosis not present

## 2022-02-28 DIAGNOSIS — M6281 Muscle weakness (generalized): Secondary | ICD-10-CM | POA: Diagnosis not present

## 2022-02-28 DIAGNOSIS — R262 Difficulty in walking, not elsewhere classified: Secondary | ICD-10-CM | POA: Diagnosis not present

## 2022-03-07 DIAGNOSIS — M6281 Muscle weakness (generalized): Secondary | ICD-10-CM | POA: Diagnosis not present

## 2022-03-07 DIAGNOSIS — R2681 Unsteadiness on feet: Secondary | ICD-10-CM | POA: Diagnosis not present

## 2022-03-07 DIAGNOSIS — R262 Difficulty in walking, not elsewhere classified: Secondary | ICD-10-CM | POA: Diagnosis not present

## 2022-03-14 DIAGNOSIS — R2681 Unsteadiness on feet: Secondary | ICD-10-CM | POA: Diagnosis not present

## 2022-03-14 DIAGNOSIS — M6281 Muscle weakness (generalized): Secondary | ICD-10-CM | POA: Diagnosis not present

## 2022-03-14 DIAGNOSIS — R262 Difficulty in walking, not elsewhere classified: Secondary | ICD-10-CM | POA: Diagnosis not present

## 2022-03-27 DIAGNOSIS — M810 Age-related osteoporosis without current pathological fracture: Secondary | ICD-10-CM | POA: Diagnosis not present

## 2022-03-27 DIAGNOSIS — E782 Mixed hyperlipidemia: Secondary | ICD-10-CM | POA: Diagnosis not present

## 2022-03-27 DIAGNOSIS — I10 Essential (primary) hypertension: Secondary | ICD-10-CM | POA: Diagnosis not present

## 2022-03-27 DIAGNOSIS — N183 Chronic kidney disease, stage 3 unspecified: Secondary | ICD-10-CM | POA: Diagnosis not present

## 2022-03-28 DIAGNOSIS — R262 Difficulty in walking, not elsewhere classified: Secondary | ICD-10-CM | POA: Diagnosis not present

## 2022-03-28 DIAGNOSIS — R2681 Unsteadiness on feet: Secondary | ICD-10-CM | POA: Diagnosis not present

## 2022-03-28 DIAGNOSIS — M6281 Muscle weakness (generalized): Secondary | ICD-10-CM | POA: Diagnosis not present

## 2022-04-04 DIAGNOSIS — M6281 Muscle weakness (generalized): Secondary | ICD-10-CM | POA: Diagnosis not present

## 2022-04-04 DIAGNOSIS — R262 Difficulty in walking, not elsewhere classified: Secondary | ICD-10-CM | POA: Diagnosis not present

## 2022-04-04 DIAGNOSIS — R2681 Unsteadiness on feet: Secondary | ICD-10-CM | POA: Diagnosis not present

## 2022-04-11 DIAGNOSIS — M6281 Muscle weakness (generalized): Secondary | ICD-10-CM | POA: Diagnosis not present

## 2022-04-11 DIAGNOSIS — R262 Difficulty in walking, not elsewhere classified: Secondary | ICD-10-CM | POA: Diagnosis not present

## 2022-04-11 DIAGNOSIS — R2681 Unsteadiness on feet: Secondary | ICD-10-CM | POA: Diagnosis not present

## 2022-04-18 DIAGNOSIS — M6281 Muscle weakness (generalized): Secondary | ICD-10-CM | POA: Diagnosis not present

## 2022-04-18 DIAGNOSIS — R2681 Unsteadiness on feet: Secondary | ICD-10-CM | POA: Diagnosis not present

## 2022-04-18 DIAGNOSIS — R262 Difficulty in walking, not elsewhere classified: Secondary | ICD-10-CM | POA: Diagnosis not present

## 2022-05-03 DIAGNOSIS — M6281 Muscle weakness (generalized): Secondary | ICD-10-CM | POA: Diagnosis not present

## 2022-05-03 DIAGNOSIS — R262 Difficulty in walking, not elsewhere classified: Secondary | ICD-10-CM | POA: Diagnosis not present

## 2022-05-03 DIAGNOSIS — R2681 Unsteadiness on feet: Secondary | ICD-10-CM | POA: Diagnosis not present

## 2022-05-09 DIAGNOSIS — R262 Difficulty in walking, not elsewhere classified: Secondary | ICD-10-CM | POA: Diagnosis not present

## 2022-05-09 DIAGNOSIS — M6281 Muscle weakness (generalized): Secondary | ICD-10-CM | POA: Diagnosis not present

## 2022-05-09 DIAGNOSIS — R2681 Unsteadiness on feet: Secondary | ICD-10-CM | POA: Diagnosis not present

## 2022-05-15 DIAGNOSIS — D0439 Carcinoma in situ of skin of other parts of face: Secondary | ICD-10-CM | POA: Diagnosis not present

## 2022-05-15 DIAGNOSIS — L821 Other seborrheic keratosis: Secondary | ICD-10-CM | POA: Diagnosis not present

## 2022-05-15 DIAGNOSIS — I788 Other diseases of capillaries: Secondary | ICD-10-CM | POA: Diagnosis not present

## 2022-05-17 DIAGNOSIS — N183 Chronic kidney disease, stage 3 unspecified: Secondary | ICD-10-CM | POA: Diagnosis not present

## 2022-05-17 DIAGNOSIS — I10 Essential (primary) hypertension: Secondary | ICD-10-CM | POA: Diagnosis not present

## 2022-05-17 DIAGNOSIS — M810 Age-related osteoporosis without current pathological fracture: Secondary | ICD-10-CM | POA: Diagnosis not present

## 2022-05-17 DIAGNOSIS — E782 Mixed hyperlipidemia: Secondary | ICD-10-CM | POA: Diagnosis not present

## 2022-05-22 DIAGNOSIS — Z23 Encounter for immunization: Secondary | ICD-10-CM | POA: Diagnosis not present

## 2022-05-22 DIAGNOSIS — N390 Urinary tract infection, site not specified: Secondary | ICD-10-CM | POA: Diagnosis not present

## 2022-05-23 DIAGNOSIS — M6281 Muscle weakness (generalized): Secondary | ICD-10-CM | POA: Diagnosis not present

## 2022-05-23 DIAGNOSIS — R2681 Unsteadiness on feet: Secondary | ICD-10-CM | POA: Diagnosis not present

## 2022-05-23 DIAGNOSIS — R262 Difficulty in walking, not elsewhere classified: Secondary | ICD-10-CM | POA: Diagnosis not present

## 2022-05-30 DIAGNOSIS — M6281 Muscle weakness (generalized): Secondary | ICD-10-CM | POA: Diagnosis not present

## 2022-05-30 DIAGNOSIS — R262 Difficulty in walking, not elsewhere classified: Secondary | ICD-10-CM | POA: Diagnosis not present

## 2022-05-30 DIAGNOSIS — R2681 Unsteadiness on feet: Secondary | ICD-10-CM | POA: Diagnosis not present

## 2022-07-18 DIAGNOSIS — I7300 Raynaud's syndrome without gangrene: Secondary | ICD-10-CM | POA: Diagnosis not present

## 2022-07-18 DIAGNOSIS — M810 Age-related osteoporosis without current pathological fracture: Secondary | ICD-10-CM | POA: Diagnosis not present

## 2022-07-18 DIAGNOSIS — M3501 Sicca syndrome with keratoconjunctivitis: Secondary | ICD-10-CM | POA: Diagnosis not present

## 2022-07-18 DIAGNOSIS — M1991 Primary osteoarthritis, unspecified site: Secondary | ICD-10-CM | POA: Diagnosis not present

## 2022-07-18 DIAGNOSIS — Z79899 Other long term (current) drug therapy: Secondary | ICD-10-CM | POA: Diagnosis not present

## 2022-07-18 DIAGNOSIS — Z6821 Body mass index (BMI) 21.0-21.9, adult: Secondary | ICD-10-CM | POA: Diagnosis not present

## 2022-08-15 DIAGNOSIS — M3500 Sicca syndrome, unspecified: Secondary | ICD-10-CM | POA: Diagnosis not present

## 2022-08-15 DIAGNOSIS — R300 Dysuria: Secondary | ICD-10-CM | POA: Diagnosis not present

## 2022-09-13 ENCOUNTER — Other Ambulatory Visit: Payer: Self-pay | Admitting: Family Medicine

## 2022-09-13 DIAGNOSIS — Z1231 Encounter for screening mammogram for malignant neoplasm of breast: Secondary | ICD-10-CM

## 2022-10-04 ENCOUNTER — Ambulatory Visit
Admission: RE | Admit: 2022-10-04 | Discharge: 2022-10-04 | Disposition: A | Discharge status: Home / Self Care | Payer: Medicare HMO | Source: Ambulatory Visit | Attending: Family Medicine | Admitting: Family Medicine

## 2022-10-04 DIAGNOSIS — Z1231 Encounter for screening mammogram for malignant neoplasm of breast: Secondary | ICD-10-CM | POA: Diagnosis not present

## 2022-10-10 DIAGNOSIS — H3530 Unspecified macular degeneration: Secondary | ICD-10-CM | POA: Diagnosis not present

## 2022-10-10 DIAGNOSIS — H524 Presbyopia: Secondary | ICD-10-CM | POA: Diagnosis not present

## 2022-11-17 DIAGNOSIS — Z9181 History of falling: Secondary | ICD-10-CM | POA: Diagnosis not present

## 2022-11-17 DIAGNOSIS — R350 Frequency of micturition: Secondary | ICD-10-CM | POA: Diagnosis not present

## 2023-01-03 DIAGNOSIS — N390 Urinary tract infection, site not specified: Secondary | ICD-10-CM | POA: Diagnosis not present

## 2023-01-03 DIAGNOSIS — E782 Mixed hyperlipidemia: Secondary | ICD-10-CM | POA: Diagnosis not present

## 2023-01-03 DIAGNOSIS — M3500 Sicca syndrome, unspecified: Secondary | ICD-10-CM | POA: Diagnosis not present

## 2023-01-03 DIAGNOSIS — Z0000 Encounter for general adult medical examination without abnormal findings: Secondary | ICD-10-CM | POA: Diagnosis not present

## 2023-01-03 DIAGNOSIS — N183 Chronic kidney disease, stage 3 unspecified: Secondary | ICD-10-CM | POA: Diagnosis not present

## 2023-01-03 DIAGNOSIS — M810 Age-related osteoporosis without current pathological fracture: Secondary | ICD-10-CM | POA: Diagnosis not present

## 2023-01-03 DIAGNOSIS — R300 Dysuria: Secondary | ICD-10-CM | POA: Diagnosis not present

## 2023-01-03 DIAGNOSIS — R7303 Prediabetes: Secondary | ICD-10-CM | POA: Diagnosis not present

## 2023-01-03 DIAGNOSIS — I10 Essential (primary) hypertension: Secondary | ICD-10-CM | POA: Diagnosis not present

## 2023-01-03 DIAGNOSIS — Z23 Encounter for immunization: Secondary | ICD-10-CM | POA: Diagnosis not present

## 2023-01-09 DIAGNOSIS — Z79899 Other long term (current) drug therapy: Secondary | ICD-10-CM | POA: Diagnosis not present

## 2023-01-16 DIAGNOSIS — I7300 Raynaud's syndrome without gangrene: Secondary | ICD-10-CM | POA: Diagnosis not present

## 2023-01-16 DIAGNOSIS — Z79899 Other long term (current) drug therapy: Secondary | ICD-10-CM | POA: Diagnosis not present

## 2023-01-16 DIAGNOSIS — Z6821 Body mass index (BMI) 21.0-21.9, adult: Secondary | ICD-10-CM | POA: Diagnosis not present

## 2023-01-16 DIAGNOSIS — M810 Age-related osteoporosis without current pathological fracture: Secondary | ICD-10-CM | POA: Diagnosis not present

## 2023-01-16 DIAGNOSIS — M3501 Sicca syndrome with keratoconjunctivitis: Secondary | ICD-10-CM | POA: Diagnosis not present

## 2023-01-16 DIAGNOSIS — M1991 Primary osteoarthritis, unspecified site: Secondary | ICD-10-CM | POA: Diagnosis not present

## 2023-01-17 DIAGNOSIS — N3020 Other chronic cystitis without hematuria: Secondary | ICD-10-CM | POA: Diagnosis not present

## 2023-01-17 DIAGNOSIS — N952 Postmenopausal atrophic vaginitis: Secondary | ICD-10-CM | POA: Diagnosis not present

## 2023-01-29 ENCOUNTER — Emergency Department (HOSPITAL_COMMUNITY)
Admission: EM | Admit: 2023-01-29 | Discharge: 2023-01-30 | Discharge status: Against Medical Advice | Payer: Medicare HMO

## 2023-01-31 DIAGNOSIS — R300 Dysuria: Secondary | ICD-10-CM | POA: Diagnosis not present

## 2023-02-06 ENCOUNTER — Other Ambulatory Visit: Payer: Self-pay | Admitting: Family Medicine

## 2023-02-06 DIAGNOSIS — K869 Disease of pancreas, unspecified: Secondary | ICD-10-CM

## 2023-02-06 DIAGNOSIS — R630 Anorexia: Secondary | ICD-10-CM | POA: Diagnosis not present

## 2023-02-06 DIAGNOSIS — M6281 Muscle weakness (generalized): Secondary | ICD-10-CM | POA: Diagnosis not present

## 2023-02-15 DIAGNOSIS — R8271 Bacteriuria: Secondary | ICD-10-CM | POA: Diagnosis not present

## 2023-02-15 DIAGNOSIS — N3020 Other chronic cystitis without hematuria: Secondary | ICD-10-CM | POA: Diagnosis not present

## 2023-02-15 DIAGNOSIS — N952 Postmenopausal atrophic vaginitis: Secondary | ICD-10-CM | POA: Diagnosis not present

## 2023-03-04 ENCOUNTER — Other Ambulatory Visit: Payer: Medicare HMO

## 2023-03-05 DIAGNOSIS — R531 Weakness: Secondary | ICD-10-CM | POA: Diagnosis not present

## 2023-03-05 DIAGNOSIS — R2689 Other abnormalities of gait and mobility: Secondary | ICD-10-CM | POA: Diagnosis not present

## 2023-03-07 DIAGNOSIS — R2689 Other abnormalities of gait and mobility: Secondary | ICD-10-CM | POA: Diagnosis not present

## 2023-03-07 DIAGNOSIS — R531 Weakness: Secondary | ICD-10-CM | POA: Diagnosis not present

## 2023-03-13 DIAGNOSIS — S93602A Unspecified sprain of left foot, initial encounter: Secondary | ICD-10-CM | POA: Diagnosis not present

## 2023-03-13 DIAGNOSIS — S93601A Unspecified sprain of right foot, initial encounter: Secondary | ICD-10-CM | POA: Diagnosis not present

## 2023-03-13 DIAGNOSIS — G8911 Acute pain due to trauma: Secondary | ICD-10-CM | POA: Diagnosis not present

## 2023-03-13 DIAGNOSIS — S93609A Unspecified sprain of unspecified foot, initial encounter: Secondary | ICD-10-CM | POA: Diagnosis not present

## 2023-03-13 DIAGNOSIS — Z043 Encounter for examination and observation following other accident: Secondary | ICD-10-CM | POA: Diagnosis not present

## 2023-03-13 DIAGNOSIS — S0990XA Unspecified injury of head, initial encounter: Secondary | ICD-10-CM | POA: Diagnosis not present

## 2023-03-13 DIAGNOSIS — R296 Repeated falls: Secondary | ICD-10-CM | POA: Diagnosis not present

## 2023-03-20 DIAGNOSIS — L03115 Cellulitis of right lower limb: Secondary | ICD-10-CM | POA: Diagnosis not present

## 2023-03-20 DIAGNOSIS — M79604 Pain in right leg: Secondary | ICD-10-CM | POA: Diagnosis not present

## 2023-03-20 DIAGNOSIS — W19XXXD Unspecified fall, subsequent encounter: Secondary | ICD-10-CM | POA: Diagnosis not present

## 2023-04-02 ENCOUNTER — Ambulatory Visit
Admission: RE | Admit: 2023-04-02 | Discharge: 2023-04-02 | Disposition: A | Discharge status: Home / Self Care | Payer: Medicare HMO | Source: Ambulatory Visit | Attending: Family Medicine | Admitting: Family Medicine

## 2023-04-02 DIAGNOSIS — K869 Disease of pancreas, unspecified: Secondary | ICD-10-CM

## 2023-04-02 DIAGNOSIS — K449 Diaphragmatic hernia without obstruction or gangrene: Secondary | ICD-10-CM | POA: Diagnosis not present

## 2023-04-02 DIAGNOSIS — D3501 Benign neoplasm of right adrenal gland: Secondary | ICD-10-CM | POA: Diagnosis not present

## 2023-04-02 DIAGNOSIS — K862 Cyst of pancreas: Secondary | ICD-10-CM | POA: Diagnosis not present

## 2023-04-02 MED ORDER — GADOPICLENOL 0.5 MMOL/ML IV SOLN
5.0000 mL | Freq: Once | INTRAVENOUS | Status: AC | PRN
  Administered 2023-04-02: 5 mL via INTRAVENOUS

## 2023-04-10 DIAGNOSIS — K862 Cyst of pancreas: Secondary | ICD-10-CM | POA: Diagnosis not present

## 2023-04-17 DIAGNOSIS — R9431 Abnormal electrocardiogram [ECG] [EKG]: Secondary | ICD-10-CM | POA: Diagnosis not present

## 2023-04-17 DIAGNOSIS — E876 Hypokalemia: Secondary | ICD-10-CM | POA: Diagnosis not present

## 2023-04-17 DIAGNOSIS — R531 Weakness: Secondary | ICD-10-CM | POA: Diagnosis not present

## 2023-04-17 DIAGNOSIS — R079 Chest pain, unspecified: Secondary | ICD-10-CM | POA: Diagnosis not present

## 2023-04-17 DIAGNOSIS — R2681 Unsteadiness on feet: Secondary | ICD-10-CM | POA: Diagnosis not present

## 2023-04-17 DIAGNOSIS — R350 Frequency of micturition: Secondary | ICD-10-CM | POA: Diagnosis not present

## 2023-04-17 DIAGNOSIS — G319 Degenerative disease of nervous system, unspecified: Secondary | ICD-10-CM | POA: Diagnosis not present

## 2023-04-17 DIAGNOSIS — R39198 Other difficulties with micturition: Secondary | ICD-10-CM | POA: Diagnosis not present

## 2023-04-17 DIAGNOSIS — R9082 White matter disease, unspecified: Secondary | ICD-10-CM | POA: Diagnosis not present

## 2023-04-17 DIAGNOSIS — Z79899 Other long term (current) drug therapy: Secondary | ICD-10-CM | POA: Diagnosis not present

## 2023-04-17 DIAGNOSIS — R262 Difficulty in walking, not elsewhere classified: Secondary | ICD-10-CM | POA: Diagnosis not present

## 2023-04-17 DIAGNOSIS — R000 Tachycardia, unspecified: Secondary | ICD-10-CM | POA: Diagnosis not present

## 2023-04-23 DIAGNOSIS — I10 Essential (primary) hypertension: Secondary | ICD-10-CM | POA: Diagnosis not present

## 2023-04-23 DIAGNOSIS — N390 Urinary tract infection, site not specified: Secondary | ICD-10-CM | POA: Diagnosis not present

## 2023-04-23 DIAGNOSIS — D649 Anemia, unspecified: Secondary | ICD-10-CM | POA: Diagnosis not present

## 2023-04-23 DIAGNOSIS — E876 Hypokalemia: Secondary | ICD-10-CM | POA: Diagnosis not present

## 2023-05-01 DIAGNOSIS — D649 Anemia, unspecified: Secondary | ICD-10-CM | POA: Diagnosis not present

## 2023-05-01 DIAGNOSIS — E876 Hypokalemia: Secondary | ICD-10-CM | POA: Diagnosis not present

## 2023-05-01 DIAGNOSIS — N390 Urinary tract infection, site not specified: Secondary | ICD-10-CM | POA: Diagnosis not present

## 2023-05-01 DIAGNOSIS — I10 Essential (primary) hypertension: Secondary | ICD-10-CM | POA: Diagnosis not present

## 2023-05-03 DIAGNOSIS — E876 Hypokalemia: Secondary | ICD-10-CM | POA: Diagnosis not present

## 2023-05-03 DIAGNOSIS — N390 Urinary tract infection, site not specified: Secondary | ICD-10-CM | POA: Diagnosis not present

## 2023-05-03 DIAGNOSIS — I10 Essential (primary) hypertension: Secondary | ICD-10-CM | POA: Diagnosis not present

## 2023-05-03 DIAGNOSIS — D649 Anemia, unspecified: Secondary | ICD-10-CM | POA: Diagnosis not present

## 2023-05-09 DIAGNOSIS — E876 Hypokalemia: Secondary | ICD-10-CM | POA: Diagnosis not present

## 2023-05-09 DIAGNOSIS — D649 Anemia, unspecified: Secondary | ICD-10-CM | POA: Diagnosis not present

## 2023-05-09 DIAGNOSIS — N390 Urinary tract infection, site not specified: Secondary | ICD-10-CM | POA: Diagnosis not present

## 2023-05-09 DIAGNOSIS — I10 Essential (primary) hypertension: Secondary | ICD-10-CM | POA: Diagnosis not present

## 2023-05-18 DIAGNOSIS — N390 Urinary tract infection, site not specified: Secondary | ICD-10-CM | POA: Diagnosis not present

## 2023-05-18 DIAGNOSIS — E876 Hypokalemia: Secondary | ICD-10-CM | POA: Diagnosis not present

## 2023-05-18 DIAGNOSIS — D649 Anemia, unspecified: Secondary | ICD-10-CM | POA: Diagnosis not present

## 2023-05-18 DIAGNOSIS — I10 Essential (primary) hypertension: Secondary | ICD-10-CM | POA: Diagnosis not present

## 2023-05-23 DIAGNOSIS — D649 Anemia, unspecified: Secondary | ICD-10-CM | POA: Diagnosis not present

## 2023-05-23 DIAGNOSIS — E876 Hypokalemia: Secondary | ICD-10-CM | POA: Diagnosis not present

## 2023-05-23 DIAGNOSIS — I10 Essential (primary) hypertension: Secondary | ICD-10-CM | POA: Diagnosis not present

## 2023-05-23 DIAGNOSIS — N390 Urinary tract infection, site not specified: Secondary | ICD-10-CM | POA: Diagnosis not present

## 2023-05-24 DIAGNOSIS — D649 Anemia, unspecified: Secondary | ICD-10-CM | POA: Diagnosis not present

## 2023-05-24 DIAGNOSIS — I10 Essential (primary) hypertension: Secondary | ICD-10-CM | POA: Diagnosis not present

## 2023-05-24 DIAGNOSIS — E876 Hypokalemia: Secondary | ICD-10-CM | POA: Diagnosis not present

## 2023-05-24 DIAGNOSIS — N390 Urinary tract infection, site not specified: Secondary | ICD-10-CM | POA: Diagnosis not present

## 2023-05-29 DIAGNOSIS — I10 Essential (primary) hypertension: Secondary | ICD-10-CM | POA: Diagnosis not present

## 2023-05-29 DIAGNOSIS — E876 Hypokalemia: Secondary | ICD-10-CM | POA: Diagnosis not present

## 2023-05-29 DIAGNOSIS — N390 Urinary tract infection, site not specified: Secondary | ICD-10-CM | POA: Diagnosis not present

## 2023-05-29 DIAGNOSIS — D649 Anemia, unspecified: Secondary | ICD-10-CM | POA: Diagnosis not present

## 2023-05-30 DIAGNOSIS — R634 Abnormal weight loss: Secondary | ICD-10-CM | POA: Diagnosis not present

## 2023-05-30 DIAGNOSIS — K862 Cyst of pancreas: Secondary | ICD-10-CM | POA: Diagnosis not present

## 2023-05-30 DIAGNOSIS — K5900 Constipation, unspecified: Secondary | ICD-10-CM | POA: Diagnosis not present

## 2023-05-31 DIAGNOSIS — N390 Urinary tract infection, site not specified: Secondary | ICD-10-CM | POA: Diagnosis not present

## 2023-05-31 DIAGNOSIS — D649 Anemia, unspecified: Secondary | ICD-10-CM | POA: Diagnosis not present

## 2023-05-31 DIAGNOSIS — E876 Hypokalemia: Secondary | ICD-10-CM | POA: Diagnosis not present

## 2023-05-31 DIAGNOSIS — I10 Essential (primary) hypertension: Secondary | ICD-10-CM | POA: Diagnosis not present

## 2023-06-03 IMAGING — MR MR ABDOMEN WO/W CM
14 of 23 series · 25 of 48 positions shown · IV contrast (multihance)
Comparison: CT abdomen 06/28/2021

CLINICAL DATA: Follow-up pancreatic lesion

EXAM:
MRI ABDOMEN WITHOUT AND WITH CONTRAST
TECHNIQUE: Multiplanar multisequence MR imaging of the abdomen was performed
both before and after the administration of intravenous contrast.
CONTRAST:  10mL MULTIHANCE GADOBENATE DIMEGLUMINE 529 MG/ML IV SOLN

[Series 3: cor haste · coronal · 5.0mm · 0.74mm/px · 1 of 37 slices shown]
[im 1/37]
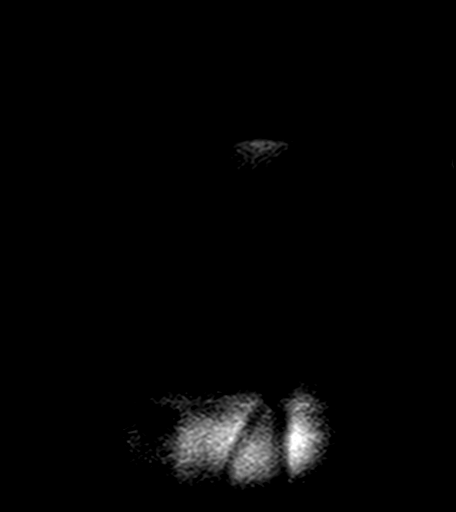

[Series 4: axial haste · axial · 6.0mm · 0.74mm/px · 1 of 34 slices shown]
[im 1/34]
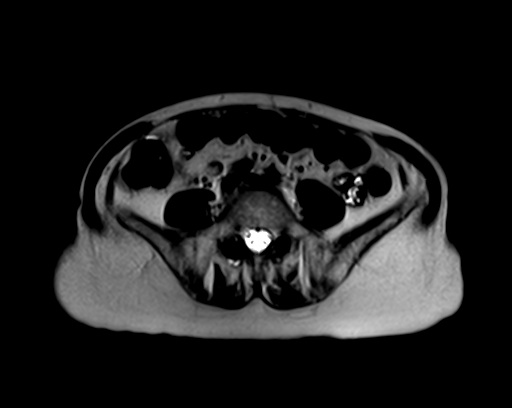

[Series 9: bSSFP · axial · 4.0mm · 0.72mm/px · 1 of 48 slices shown (1 of 2)]
[im 1/48]
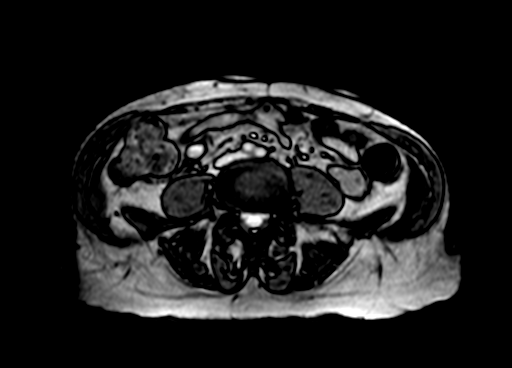

[Series 12: T2 fat-sat · axial · 6.0mm · 1.09mm/px · 1 of 32 slices shown]
[im 1/32]
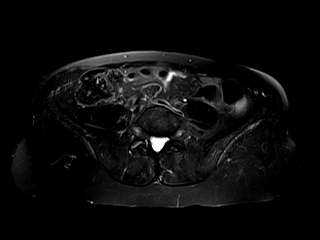

[Series 13: ep2d_diff_b50_500_800_p2_trig · axial · 6.0mm · 2.03mm/px · z∈[-138,+92]mm · 2 of 99 slices shown]
[im 1/99]
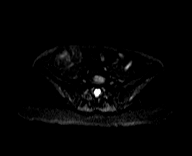
[im 99/99]
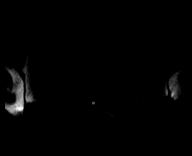

[Series 14: ep2d_diff_b50_500_800_p2_trig_adc · axial · 6.0mm · 2.03mm/px · 1 of 33 slices shown]
[im 1/33]
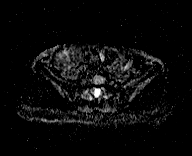

[Series 15: bSSFP · coronal · 5.0mm · 0.78mm/px · 1 of 32 slices shown (2 of 2)]
[im 1/32]
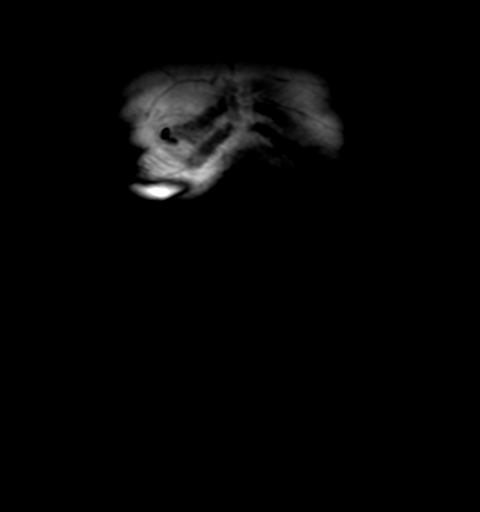

[Series 16: T2 · coronal · 3.0mm · 0.70mm/px · 1 of 56 slices shown]
[im 1/56]
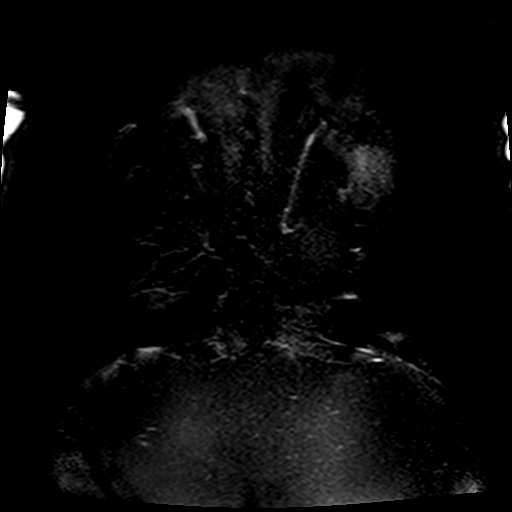

[Series 17: T1 · axial · 6.0mm · 0.74mm/px · 1 of 72 slices shown]
[im 1/72]
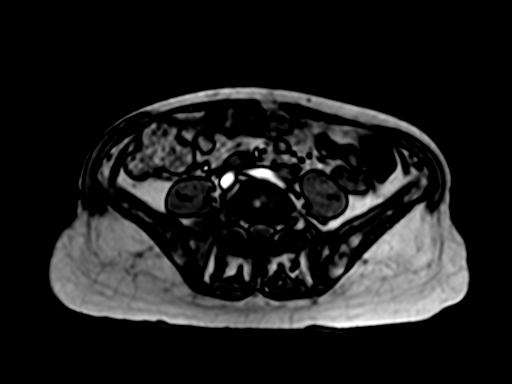

[Series 18: T1 dynamic · axial · non-contrast · 2.5mm · 0.74mm/px · z∈[-125,+93]mm · 3 of 88 slices shown]
[im 1/88]
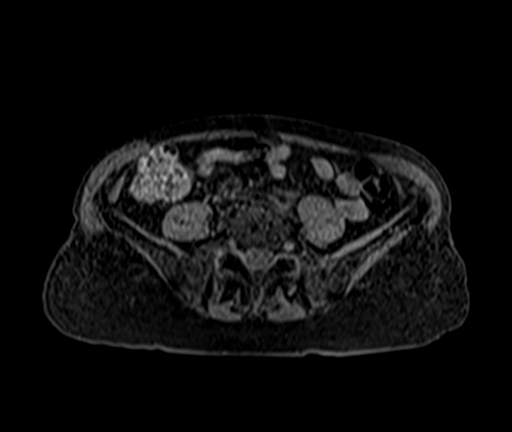
[im 44/88]
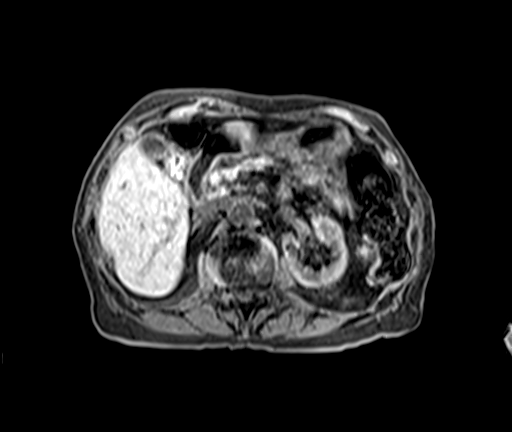
[im 88/88]
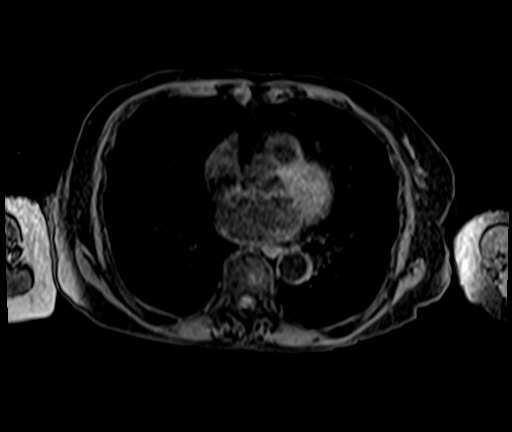

[Series 19: T1 dynamic post-contrast · axial · 2.5mm · 0.74mm/px · z∈[-125,+93]mm · 3 of 88 slices shown (1 of 4)]
[im 1/88]
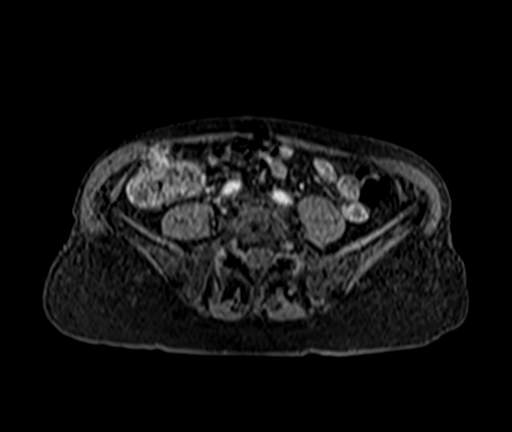
[im 44/88]
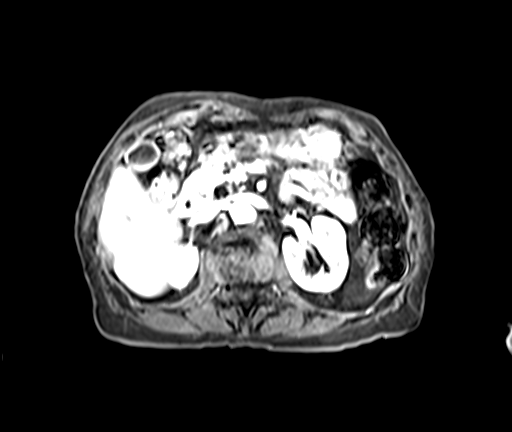
[im 88/88]
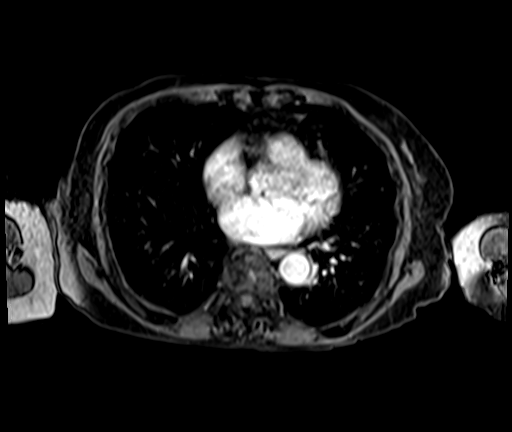

[Series 20: T1 dynamic post-contrast · axial · 2.5mm · 0.74mm/px · z∈[-125,+93]mm · 3 of 88 slices shown (2 of 4)]
[im 1/88]
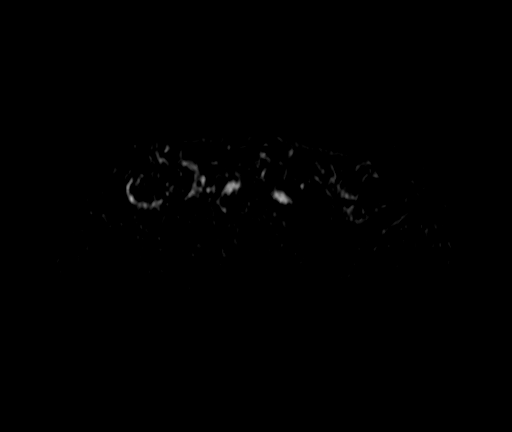
[im 44/88]
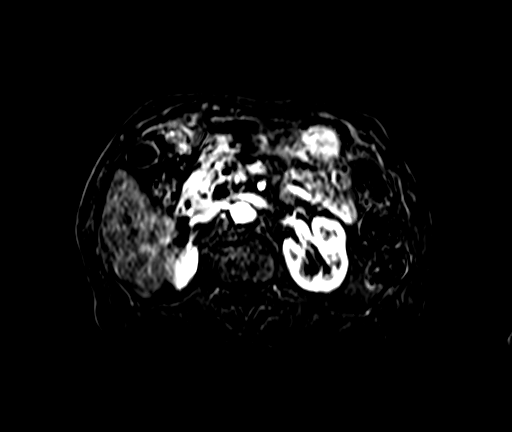
[im 88/88]
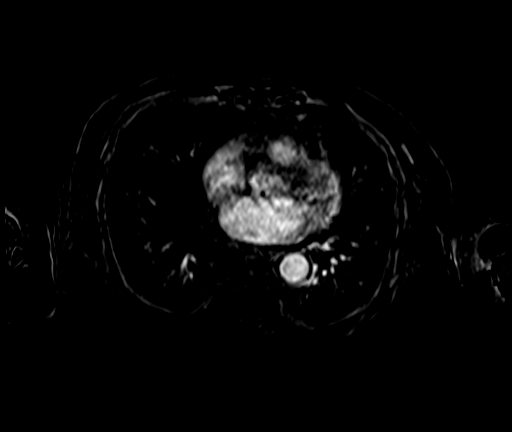

[Series 21: T1 dynamic post-contrast · axial · 2.5mm · 0.74mm/px · z∈[-125,+93]mm · 3 of 88 slices shown (3 of 4)]
[im 1/88]
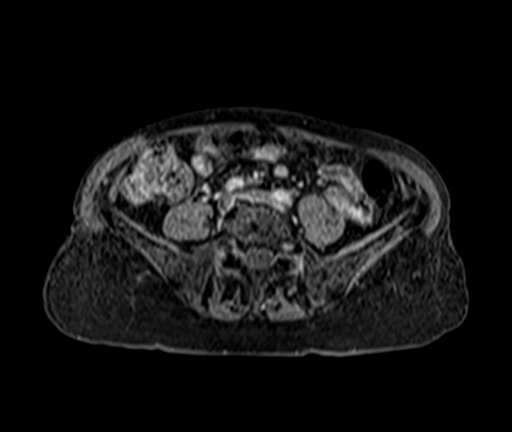
[im 44/88]
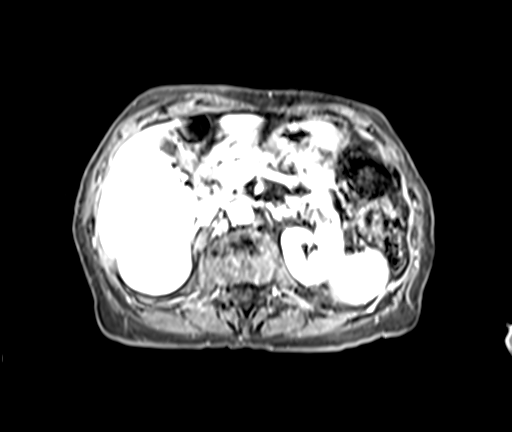
[im 88/88]
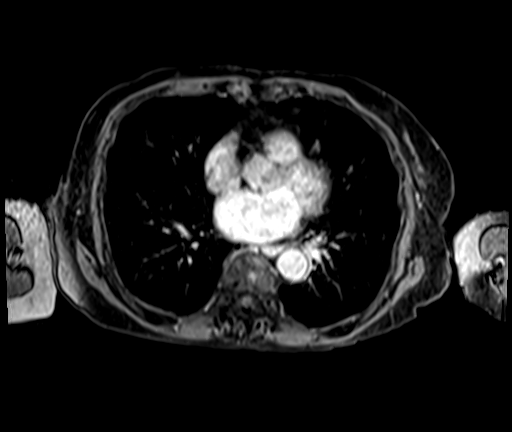

[Series 22: T1 dynamic post-contrast · axial · 2.5mm · 0.74mm/px · z∈[-125,+93]mm · 3 of 88 slices shown (4 of 4)]
[im 1/88]
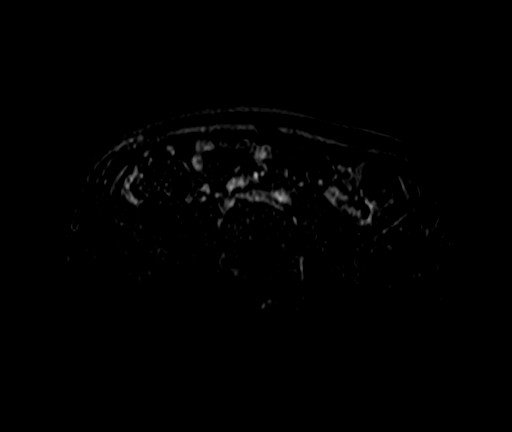
[im 44/88]
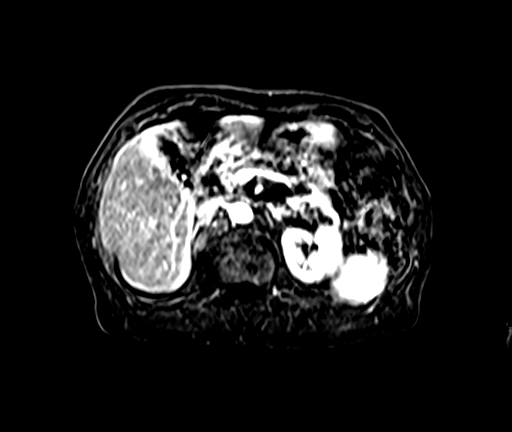
[im 88/88]
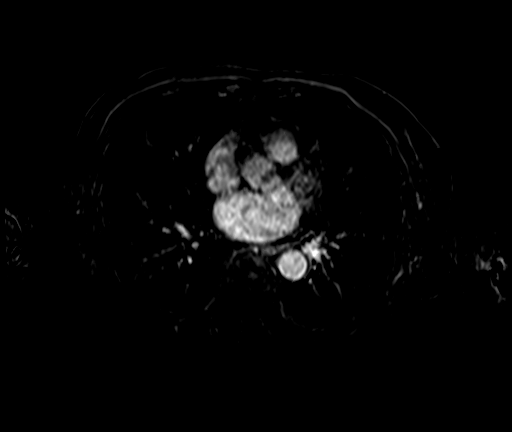

[25 of 48 positions shown; findings below may reference images not displayed]

FINDINGS: Study is limited due to motion.

Lower chest: No acute findings.

Hepatobiliary: Liver is normal in size and contour with no
suspicious mass identified. Tiny nonenhancing cyst in the right
hepatic lobe near the dome. Gallbladder appears normal. No biliary
ductal dilatation identified.

Pancreas: Innumerable cystic lesions are identified throughout the
pancreas, which do not appear to demonstrate any enhancing nodular
components. The largest cystic lesions measure up to 3.5 x 3.6 cm in
the tail of the pancreas, 1.4 cm in the anterior body, 2.1 x 1.2 cm
in the posterior pancreatic head. Main pancreatic duct is not appear
to be dilated, limited visualization. MRCP images are degraded due
to motion.

Spleen:  Within normal limits in size and appearance.

Adrenals/Urinary Tract: 1.4 cm right adrenal gland nodule which
appears to dropout signal on out of phase T1 suggesting adenoma. No
hydronephrosis or enhancing renal mass identified bilaterally.

Stomach/Bowel: No evidence of bowel obstruction.

Vascular/Lymphatic: No pathologically enlarged lymph nodes
identified. No abdominal aortic aneurysm demonstrated.

Other:  No ascites.

Musculoskeletal: No suspicious bone lesions identified.
IMPRESSION: 1. Innumerable cystic lesions visualized throughout the pancreas
with the largest in the tail measuring 3.5 x 3.6 cm. Most likely
represent side branch IPMNs as well as possible pseudocysts. No
definite suspicious enhancement identified within any of the cysts,
given limitations of motion. Recommend continued follow-up with
contrast enhanced MRI in 12 months.
2. Right adrenal gland nodule appears to be an adenoma.

## 2023-06-06 DIAGNOSIS — D649 Anemia, unspecified: Secondary | ICD-10-CM | POA: Diagnosis not present

## 2023-06-06 DIAGNOSIS — N390 Urinary tract infection, site not specified: Secondary | ICD-10-CM | POA: Diagnosis not present

## 2023-06-06 DIAGNOSIS — I10 Essential (primary) hypertension: Secondary | ICD-10-CM | POA: Diagnosis not present

## 2023-06-06 DIAGNOSIS — E876 Hypokalemia: Secondary | ICD-10-CM | POA: Diagnosis not present

## 2023-06-07 DIAGNOSIS — N390 Urinary tract infection, site not specified: Secondary | ICD-10-CM | POA: Diagnosis not present

## 2023-06-07 DIAGNOSIS — D649 Anemia, unspecified: Secondary | ICD-10-CM | POA: Diagnosis not present

## 2023-06-07 DIAGNOSIS — I10 Essential (primary) hypertension: Secondary | ICD-10-CM | POA: Diagnosis not present

## 2023-06-07 DIAGNOSIS — E876 Hypokalemia: Secondary | ICD-10-CM | POA: Diagnosis not present

## 2023-06-12 DIAGNOSIS — D649 Anemia, unspecified: Secondary | ICD-10-CM | POA: Diagnosis not present

## 2023-06-12 DIAGNOSIS — N390 Urinary tract infection, site not specified: Secondary | ICD-10-CM | POA: Diagnosis not present

## 2023-06-12 DIAGNOSIS — E876 Hypokalemia: Secondary | ICD-10-CM | POA: Diagnosis not present

## 2023-06-12 DIAGNOSIS — I10 Essential (primary) hypertension: Secondary | ICD-10-CM | POA: Diagnosis not present

## 2023-06-15 DIAGNOSIS — R54 Age-related physical debility: Secondary | ICD-10-CM | POA: Diagnosis not present

## 2023-06-18 DIAGNOSIS — I10 Essential (primary) hypertension: Secondary | ICD-10-CM | POA: Diagnosis not present

## 2023-06-18 DIAGNOSIS — D649 Anemia, unspecified: Secondary | ICD-10-CM | POA: Diagnosis not present

## 2023-06-18 DIAGNOSIS — E876 Hypokalemia: Secondary | ICD-10-CM | POA: Diagnosis not present

## 2023-06-18 DIAGNOSIS — N390 Urinary tract infection, site not specified: Secondary | ICD-10-CM | POA: Diagnosis not present

## 2023-06-27 DIAGNOSIS — R899 Unspecified abnormal finding in specimens from other organs, systems and tissues: Secondary | ICD-10-CM | POA: Diagnosis not present

## 2023-07-18 DIAGNOSIS — M1991 Primary osteoarthritis, unspecified site: Secondary | ICD-10-CM | POA: Diagnosis not present

## 2023-07-18 DIAGNOSIS — Z79899 Other long term (current) drug therapy: Secondary | ICD-10-CM | POA: Diagnosis not present

## 2023-07-18 DIAGNOSIS — R634 Abnormal weight loss: Secondary | ICD-10-CM | POA: Diagnosis not present

## 2023-07-18 DIAGNOSIS — Z681 Body mass index (BMI) 19 or less, adult: Secondary | ICD-10-CM | POA: Diagnosis not present

## 2023-07-18 DIAGNOSIS — M3501 Sicca syndrome with keratoconjunctivitis: Secondary | ICD-10-CM | POA: Diagnosis not present

## 2023-07-18 DIAGNOSIS — I7300 Raynaud's syndrome without gangrene: Secondary | ICD-10-CM | POA: Diagnosis not present

## 2023-07-31 DIAGNOSIS — R2689 Other abnormalities of gait and mobility: Secondary | ICD-10-CM | POA: Diagnosis not present

## 2023-07-31 DIAGNOSIS — R531 Weakness: Secondary | ICD-10-CM | POA: Diagnosis not present

## 2023-08-02 DIAGNOSIS — R634 Abnormal weight loss: Secondary | ICD-10-CM | POA: Diagnosis not present

## 2023-08-02 DIAGNOSIS — K862 Cyst of pancreas: Secondary | ICD-10-CM | POA: Diagnosis not present

## 2023-08-07 DIAGNOSIS — R531 Weakness: Secondary | ICD-10-CM | POA: Diagnosis not present

## 2023-08-07 DIAGNOSIS — R2689 Other abnormalities of gait and mobility: Secondary | ICD-10-CM | POA: Diagnosis not present

## 2023-08-09 DIAGNOSIS — R2689 Other abnormalities of gait and mobility: Secondary | ICD-10-CM | POA: Diagnosis not present

## 2023-08-09 DIAGNOSIS — R531 Weakness: Secondary | ICD-10-CM | POA: Diagnosis not present

## 2023-08-14 DIAGNOSIS — R2689 Other abnormalities of gait and mobility: Secondary | ICD-10-CM | POA: Diagnosis not present

## 2023-08-14 DIAGNOSIS — R531 Weakness: Secondary | ICD-10-CM | POA: Diagnosis not present

## 2023-08-16 DIAGNOSIS — R2689 Other abnormalities of gait and mobility: Secondary | ICD-10-CM | POA: Diagnosis not present

## 2023-08-16 DIAGNOSIS — R531 Weakness: Secondary | ICD-10-CM | POA: Diagnosis not present

## 2023-08-21 DIAGNOSIS — R2689 Other abnormalities of gait and mobility: Secondary | ICD-10-CM | POA: Diagnosis not present

## 2023-08-21 DIAGNOSIS — R531 Weakness: Secondary | ICD-10-CM | POA: Diagnosis not present

## 2023-08-23 DIAGNOSIS — R531 Weakness: Secondary | ICD-10-CM | POA: Diagnosis not present

## 2023-08-23 DIAGNOSIS — R2689 Other abnormalities of gait and mobility: Secondary | ICD-10-CM | POA: Diagnosis not present

## 2023-08-28 DIAGNOSIS — R2689 Other abnormalities of gait and mobility: Secondary | ICD-10-CM | POA: Diagnosis not present

## 2023-08-28 DIAGNOSIS — R531 Weakness: Secondary | ICD-10-CM | POA: Diagnosis not present

## 2023-08-30 DIAGNOSIS — R2689 Other abnormalities of gait and mobility: Secondary | ICD-10-CM | POA: Diagnosis not present

## 2023-08-30 DIAGNOSIS — R531 Weakness: Secondary | ICD-10-CM | POA: Diagnosis not present

## 2023-09-04 DIAGNOSIS — R531 Weakness: Secondary | ICD-10-CM | POA: Diagnosis not present

## 2023-09-04 DIAGNOSIS — R2689 Other abnormalities of gait and mobility: Secondary | ICD-10-CM | POA: Diagnosis not present

## 2023-09-07 DIAGNOSIS — R531 Weakness: Secondary | ICD-10-CM | POA: Diagnosis not present

## 2023-09-07 DIAGNOSIS — R2689 Other abnormalities of gait and mobility: Secondary | ICD-10-CM | POA: Diagnosis not present

## 2023-09-11 DIAGNOSIS — R531 Weakness: Secondary | ICD-10-CM | POA: Diagnosis not present

## 2023-09-11 DIAGNOSIS — R2689 Other abnormalities of gait and mobility: Secondary | ICD-10-CM | POA: Diagnosis not present

## 2023-09-13 DIAGNOSIS — R531 Weakness: Secondary | ICD-10-CM | POA: Diagnosis not present

## 2023-09-13 DIAGNOSIS — R2689 Other abnormalities of gait and mobility: Secondary | ICD-10-CM | POA: Diagnosis not present

## 2023-09-14 ENCOUNTER — Encounter (HOSPITAL_COMMUNITY): Payer: Self-pay | Admitting: *Deleted

## 2023-09-14 ENCOUNTER — Other Ambulatory Visit: Payer: Self-pay

## 2023-09-14 ENCOUNTER — Emergency Department (HOSPITAL_COMMUNITY)

## 2023-09-14 ENCOUNTER — Emergency Department (HOSPITAL_COMMUNITY)
Admission: EM | Admit: 2023-09-14 | Discharge: 2023-09-14 | Disposition: A | Discharge status: Home / Self Care | Attending: Emergency Medicine | Admitting: Emergency Medicine

## 2023-09-14 DIAGNOSIS — R188 Other ascites: Secondary | ICD-10-CM | POA: Insufficient documentation

## 2023-09-14 DIAGNOSIS — I10 Essential (primary) hypertension: Secondary | ICD-10-CM | POA: Insufficient documentation

## 2023-09-14 DIAGNOSIS — R140 Abdominal distension (gaseous): Secondary | ICD-10-CM | POA: Diagnosis not present

## 2023-09-14 DIAGNOSIS — R748 Abnormal levels of other serum enzymes: Secondary | ICD-10-CM

## 2023-09-14 DIAGNOSIS — K862 Cyst of pancreas: Secondary | ICD-10-CM | POA: Diagnosis not present

## 2023-09-14 DIAGNOSIS — Z79899 Other long term (current) drug therapy: Secondary | ICD-10-CM | POA: Diagnosis not present

## 2023-09-14 DIAGNOSIS — R109 Unspecified abdominal pain: Secondary | ICD-10-CM | POA: Diagnosis present

## 2023-09-14 DIAGNOSIS — R1031 Right lower quadrant pain: Secondary | ICD-10-CM | POA: Diagnosis not present

## 2023-09-14 DIAGNOSIS — R509 Fever, unspecified: Secondary | ICD-10-CM | POA: Diagnosis not present

## 2023-09-14 DIAGNOSIS — R6881 Early satiety: Secondary | ICD-10-CM | POA: Diagnosis not present

## 2023-09-14 LAB — URINALYSIS, ROUTINE W REFLEX MICROSCOPIC
Bilirubin Urine: NEGATIVE
Glucose, UA: NEGATIVE mg/dL
Ketones, ur: 20 mg/dL — AB
Nitrite: NEGATIVE
Protein, ur: NEGATIVE mg/dL
Specific Gravity, Urine: 1.018 (ref 1.005–1.030)
pH: 5 (ref 5.0–8.0)

## 2023-09-14 LAB — CBC WITH DIFFERENTIAL/PLATELET
Abs Immature Granulocytes: 0.05 10*3/uL (ref 0.00–0.07)
Basophils Absolute: 0 10*3/uL (ref 0.0–0.1)
Basophils Relative: 0 %
Eosinophils Absolute: 0 10*3/uL (ref 0.0–0.5)
Eosinophils Relative: 0 %
HCT: 43.7 % (ref 36.0–46.0)
Hemoglobin: 13.9 g/dL (ref 12.0–15.0)
Immature Granulocytes: 1 %
Lymphocytes Relative: 7 %
Lymphs Abs: 0.6 10*3/uL — ABNORMAL LOW (ref 0.7–4.0)
MCH: 31 pg (ref 26.0–34.0)
MCHC: 31.8 g/dL (ref 30.0–36.0)
MCV: 97.3 fL (ref 80.0–100.0)
Monocytes Absolute: 0.7 10*3/uL (ref 0.1–1.0)
Monocytes Relative: 9 %
Neutro Abs: 6.9 10*3/uL (ref 1.7–7.7)
Neutrophils Relative %: 83 %
Platelets: 164 10*3/uL (ref 150–400)
RBC: 4.49 MIL/uL (ref 3.87–5.11)
RDW: 14.3 % (ref 11.5–15.5)
WBC: 8.3 10*3/uL (ref 4.0–10.5)
nRBC: 0 % (ref 0.0–0.2)

## 2023-09-14 LAB — COMPREHENSIVE METABOLIC PANEL WITH GFR
ALT: 17 U/L (ref 0–44)
AST: 23 U/L (ref 15–41)
Albumin: 3.9 g/dL (ref 3.5–5.0)
Alkaline Phosphatase: 64 U/L (ref 38–126)
Anion gap: 12 (ref 5–15)
BUN: 27 mg/dL — ABNORMAL HIGH (ref 8–23)
CO2: 24 mmol/L (ref 22–32)
Calcium: 8.9 mg/dL (ref 8.9–10.3)
Chloride: 100 mmol/L (ref 98–111)
Creatinine, Ser: 0.7 mg/dL (ref 0.44–1.00)
GFR, Estimated: >60 mL/min (ref >60)
Glucose, Bld: 105 mg/dL — ABNORMAL HIGH (ref 70–99)
Potassium: 4.2 mmol/L (ref 3.5–5.1)
Sodium: 136 mmol/L (ref 135–145)
Total Bilirubin: 1 mg/dL (ref 0.0–1.2)
Total Protein: 7.5 g/dL (ref 6.5–8.1)

## 2023-09-14 LAB — LIPASE, BLOOD: Lipase: 1674 U/L — ABNORMAL HIGH (ref 11–51)

## 2023-09-14 MED ORDER — OXYCODONE-ACETAMINOPHEN 5-325 MG PO TABS
1.0000 | ORAL_TABLET | Freq: Once | ORAL | Status: AC
  Administered 2023-09-14: 1 via ORAL
  Filled 2023-09-14: qty 1

## 2023-09-14 MED ORDER — OXYCODONE HCL 5 MG PO TABS
5.0000 mg | ORAL_TABLET | Freq: Four times a day (QID) | ORAL | 0 refills | Status: DC | PRN
Start: 2023-09-14 — End: 2023-09-27

## 2023-09-14 MED ORDER — ONDANSETRON HCL 4 MG/2ML IJ SOLN
4.0000 mg | Freq: Once | INTRAMUSCULAR | Status: AC
  Administered 2023-09-14: 4 mg via INTRAVENOUS
  Filled 2023-09-14: qty 2

## 2023-09-14 MED ORDER — SENNOSIDES-DOCUSATE SODIUM 8.6-50 MG PO TABS: 1.0000 | ORAL_TABLET | Freq: Every day | ORAL | 0 refills | Status: DC

## 2023-09-14 MED ORDER — IOHEXOL 300 MG/ML  SOLN
80.0000 mL | Freq: Once | INTRAMUSCULAR | Status: AC | PRN
  Administered 2023-09-14: 80 mL via INTRAVENOUS

## 2023-09-14 MED ORDER — ONDANSETRON HCL 4 MG PO TABS: 4.0000 mg | ORAL_TABLET | Freq: Four times a day (QID) | ORAL | 0 refills | Status: DC

## 2023-09-14 NOTE — ED Provider Notes (Signed)
 Patient signed out me at 1600 by Dr. Val Garin pending labs and CTAP. In short this is a 88 year old female with PMH of HTN, HLD, Sjogren's and pancreatic cysts presenting to the emergency department department from [facility] walk in clinic with abdominal pain. She was hemodynamically stable on arrival in no acute distress, labs within normal range. UA and CTAP pending.  Clinical Course as of 09/14/23 1853  Fri Sep 14, 2023  1742 Lipase 1600 and CTAP with increased size of pancreatic cysts without pancreatic inflammation and small amount of ascites. Possible colitis.  [VK]  [id number] Patient previously had not wanted to pursue further work up with biopsy due to high risk of the procedure. She states she is amenable to recommendations from GI today as things have progressed. She follows with Dr. Celesta Coke GI consulted.  [VK]  [id number] I spoke with Dr. Honey Lusty with GI who states patient's work up can be pursued as an outpatient unless she needs admission for symptomatic management. Patient's pain and nausea is well controlled. She is stable for discharge and was given strict return precautions. [VK]    Clinical Course User Index [VK] Kingsley, Shawnte Demarest K, DO      Kingsley, Trenden Hazelrigg K, [place] 09/14/23 [phone]
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
## 2023-09-14 NOTE — Discharge Instructions (Addendum)
 You were seen in the emergency department for your abdominal pain.  Your workup appears as though the cysts on your pancreas have increased in size and you now have some fluid in your belly called ascites that is likely associated with the cysts.  We spoke with GI and you should follow-up with Dr. Veronda Goody as an outpatient to discuss next steps as far as further workup.  You can take Tylenol  every 6 hours as needed for pain and I have given you oxycodone for breakthrough pain.  This can make you drowsy so do not take it while driving, working or operating heavy machinery.  It can also make you constipated so have given you a stool softener to take while on it.  You should return to the emergency department if you have repetitive vomiting despite the nausea medication, you have significant increase of your pain, you fevers or any other new or concerning symptoms.

## 2023-09-14 NOTE — ED Triage Notes (Addendum)
 BIB family from [facility], "sent for CT scan", here for abd pain, fatigue and nausea. (Denies: bleeding, fever, vomiting or diarrhea). Last BM yesterday. Mentions pancreatic cysts. Alert, NAD, calm, interactive.

## 2023-09-17 DIAGNOSIS — Z8719 Personal history of other diseases of the digestive system: Secondary | ICD-10-CM | POA: Diagnosis not present

## 2023-09-17 DIAGNOSIS — R188 Other ascites: Secondary | ICD-10-CM | POA: Diagnosis not present

## 2023-09-17 DIAGNOSIS — K5900 Constipation, unspecified: Secondary | ICD-10-CM | POA: Diagnosis not present

## 2023-09-17 DIAGNOSIS — K862 Cyst of pancreas: Secondary | ICD-10-CM | POA: Diagnosis not present

## 2023-09-18 ENCOUNTER — Other Ambulatory Visit: Payer: Self-pay | Admitting: Gastroenterology

## 2023-09-18 DIAGNOSIS — R188 Other ascites: Secondary | ICD-10-CM | POA: Diagnosis not present

## 2023-09-18 DIAGNOSIS — K862 Cyst of pancreas: Secondary | ICD-10-CM | POA: Diagnosis not present

## 2023-09-18 DIAGNOSIS — K5900 Constipation, unspecified: Secondary | ICD-10-CM | POA: Diagnosis not present

## 2023-09-18 DIAGNOSIS — R634 Abnormal weight loss: Secondary | ICD-10-CM | POA: Diagnosis not present

## 2023-09-18 DIAGNOSIS — K859 Acute pancreatitis without necrosis or infection, unspecified: Secondary | ICD-10-CM | POA: Diagnosis not present

## 2023-09-20 ENCOUNTER — Inpatient Hospital Stay (HOSPITAL_COMMUNITY)
Admission: EM | Admit: 2023-09-20 | Discharge: 2023-09-30 | DRG: 435 | Disposition: E | Attending: Internal Medicine | Admitting: Internal Medicine

## 2023-09-20 ENCOUNTER — Other Ambulatory Visit: Payer: Self-pay

## 2023-09-20 ENCOUNTER — Encounter (HOSPITAL_COMMUNITY): Payer: Self-pay

## 2023-09-20 DIAGNOSIS — R627 Adult failure to thrive: Secondary | ICD-10-CM | POA: Diagnosis present

## 2023-09-20 DIAGNOSIS — Z833 Family history of diabetes mellitus: Secondary | ICD-10-CM

## 2023-09-20 DIAGNOSIS — I10 Essential (primary) hypertension: Secondary | ICD-10-CM | POA: Diagnosis present

## 2023-09-20 DIAGNOSIS — K862 Cyst of pancreas: Secondary | ICD-10-CM | POA: Diagnosis present

## 2023-09-20 DIAGNOSIS — E278 Other specified disorders of adrenal gland: Secondary | ICD-10-CM | POA: Diagnosis not present

## 2023-09-20 DIAGNOSIS — Z90710 Acquired absence of both cervix and uterus: Secondary | ICD-10-CM | POA: Diagnosis not present

## 2023-09-20 DIAGNOSIS — R1031 Right lower quadrant pain: Secondary | ICD-10-CM | POA: Diagnosis not present

## 2023-09-20 DIAGNOSIS — R109 Unspecified abdominal pain: Secondary | ICD-10-CM | POA: Diagnosis not present

## 2023-09-20 DIAGNOSIS — R935 Abnormal findings on diagnostic imaging of other abdominal regions, including retroperitoneum: Secondary | ICD-10-CM | POA: Diagnosis not present

## 2023-09-20 DIAGNOSIS — E785 Hyperlipidemia, unspecified: Secondary | ICD-10-CM | POA: Diagnosis present

## 2023-09-20 DIAGNOSIS — M8580 Other specified disorders of bone density and structure, unspecified site: Secondary | ICD-10-CM | POA: Diagnosis present

## 2023-09-20 DIAGNOSIS — R188 Other ascites: Principal | ICD-10-CM

## 2023-09-20 DIAGNOSIS — R64 Cachexia: Secondary | ICD-10-CM | POA: Diagnosis present

## 2023-09-20 DIAGNOSIS — Z853 Personal history of malignant neoplasm of breast: Secondary | ICD-10-CM

## 2023-09-20 DIAGNOSIS — Z66 Do not resuscitate: Secondary | ICD-10-CM | POA: Diagnosis not present

## 2023-09-20 DIAGNOSIS — Z87891 Personal history of nicotine dependence: Secondary | ICD-10-CM | POA: Diagnosis not present

## 2023-09-20 DIAGNOSIS — Z79811 Long term (current) use of aromatase inhibitors: Secondary | ICD-10-CM

## 2023-09-20 DIAGNOSIS — I7300 Raynaud's syndrome without gangrene: Secondary | ICD-10-CM | POA: Diagnosis present

## 2023-09-20 DIAGNOSIS — Z79899 Other long term (current) drug therapy: Secondary | ICD-10-CM | POA: Diagnosis not present

## 2023-09-20 DIAGNOSIS — R1084 Generalized abdominal pain: Secondary | ICD-10-CM | POA: Diagnosis not present

## 2023-09-20 DIAGNOSIS — R180 Malignant ascites: Secondary | ICD-10-CM | POA: Diagnosis present

## 2023-09-20 DIAGNOSIS — Z7983 Long term (current) use of bisphosphonates: Secondary | ICD-10-CM

## 2023-09-20 DIAGNOSIS — K859 Acute pancreatitis without necrosis or infection, unspecified: Secondary | ICD-10-CM | POA: Diagnosis present

## 2023-09-20 DIAGNOSIS — R748 Abnormal levels of other serum enzymes: Secondary | ICD-10-CM | POA: Diagnosis not present

## 2023-09-20 DIAGNOSIS — C259 Malignant neoplasm of pancreas, unspecified: Secondary | ICD-10-CM | POA: Diagnosis not present

## 2023-09-20 DIAGNOSIS — Z681 Body mass index (BMI) 19 or less, adult: Secondary | ICD-10-CM | POA: Diagnosis not present

## 2023-09-20 DIAGNOSIS — Z515 Encounter for palliative care: Secondary | ICD-10-CM | POA: Diagnosis not present

## 2023-09-20 DIAGNOSIS — E43 Unspecified severe protein-calorie malnutrition: Secondary | ICD-10-CM | POA: Diagnosis present

## 2023-09-20 DIAGNOSIS — I9589 Other hypotension: Secondary | ICD-10-CM | POA: Diagnosis present

## 2023-09-20 DIAGNOSIS — C800 Disseminated malignant neoplasm, unspecified: Secondary | ICD-10-CM | POA: Diagnosis not present

## 2023-09-20 DIAGNOSIS — Z882 Allergy status to sulfonamides status: Secondary | ICD-10-CM

## 2023-09-20 DIAGNOSIS — R54 Age-related physical debility: Secondary | ICD-10-CM | POA: Diagnosis present

## 2023-09-20 DIAGNOSIS — Z8041 Family history of malignant neoplasm of ovary: Secondary | ICD-10-CM

## 2023-09-20 DIAGNOSIS — Z8249 Family history of ischemic heart disease and other diseases of the circulatory system: Secondary | ICD-10-CM

## 2023-09-20 DIAGNOSIS — C786 Secondary malignant neoplasm of retroperitoneum and peritoneum: Secondary | ICD-10-CM | POA: Diagnosis present

## 2023-09-20 DIAGNOSIS — M3500 Sicca syndrome, unspecified: Secondary | ICD-10-CM | POA: Diagnosis present

## 2023-09-20 DIAGNOSIS — Z7189 Other specified counseling: Secondary | ICD-10-CM

## 2023-09-20 DIAGNOSIS — I872 Venous insufficiency (chronic) (peripheral): Secondary | ICD-10-CM | POA: Diagnosis present

## 2023-09-20 DIAGNOSIS — K861 Other chronic pancreatitis: Secondary | ICD-10-CM | POA: Diagnosis not present

## 2023-09-20 LAB — CBC WITH DIFFERENTIAL/PLATELET
Abs Immature Granulocytes: 0.07 10*3/uL (ref 0.00–0.07)
Basophils Absolute: 0 10*3/uL (ref 0.0–0.1)
Basophils Relative: 0 %
Eosinophils Absolute: 0.1 10*3/uL (ref 0.0–0.5)
Eosinophils Relative: 1 %
HCT: 42.3 % (ref 36.0–46.0)
Hemoglobin: 13.6 g/dL (ref 12.0–15.0)
Immature Granulocytes: 1 %
Lymphocytes Relative: 5 %
Lymphs Abs: 0.4 10*3/uL — ABNORMAL LOW (ref 0.7–4.0)
MCH: 31.1 pg (ref 26.0–34.0)
MCHC: 32.2 g/dL (ref 30.0–36.0)
MCV: 96.6 fL (ref 80.0–100.0)
Monocytes Absolute: 0.7 10*3/uL (ref 0.1–1.0)
Monocytes Relative: 8 %
Neutro Abs: 7.7 10*3/uL (ref 1.7–7.7)
Neutrophils Relative %: 85 %
Platelets: 267 10*3/uL (ref 150–400)
RBC: 4.38 MIL/uL (ref 3.87–5.11)
RDW: 14.1 % (ref 11.5–15.5)
WBC: 8.9 10*3/uL (ref 4.0–10.5)
nRBC: 0 % (ref 0.0–0.2)

## 2023-09-20 MED ORDER — FENTANYL CITRATE PF 50 MCG/ML IJ SOSY
50.0000 ug | PREFILLED_SYRINGE | Freq: Once | INTRAMUSCULAR | Status: AC
  Administered 2023-09-20: 50 ug via INTRAVENOUS
  Filled 2023-09-20: qty 1

## 2023-09-20 MED ORDER — ONDANSETRON HCL 4 MG/2ML IJ SOLN
4.0000 mg | Freq: Once | INTRAMUSCULAR | Status: AC
  Administered 2023-09-20: 4 mg via INTRAVENOUS
  Filled 2023-09-20: qty 2

## 2023-09-20 NOTE — ED Triage Notes (Addendum)
 Says she has been having increased upper abdominal pain that radiate to left shoulder since last visit on 5/16 and suspects her pancreas or ascites is causing the pain.   She has been taking her pancreatic enzymes as prescribed.

## 2023-09-20 NOTE — ED Provider Notes (Signed)
 [place] EMERGENCY DEPARTMENT AT [facility] Provider Note   CSN: [id number] Arrival date & time: 09/20/23  2239     History  Chief Complaint  Patient presents with   Abdominal Pain    Sheila Pugh is a 88 y.o. female past medical history significant for hypertension and pancreatic cyst presents today for abdominal pain.  Patient was seen on 5/16 for similar pain found to have a pancreatic cyst and small amount of ascites.  Patient reports generalized and right lower quadrant abdominal pain.  She also describes bloating.  Also reports reduced oral intake and nausea.  Patient denies vomiting, fever, chills, chest pain, shortness of breath, or diarrhea.   Abdominal Pain Associated symptoms: nausea        Home Medications Prior to Admission medications   Medication Sig Start Date End Date Taking? Authorizing Provider  alendronate (FOSAMAX) 70 MG tablet Take 70 mg by mouth every Monday. 11/03/18   [provider]  ALPRAZolam  (XANAX ) 0.25 MG tablet Take 1 tablet (0.25 mg total) by mouth at bedtime as needed for sleep. sleep Patient not taking: Reported on 12/07/2018 09/01/15   Deforest Fast, MD  amLODipine  (NORVASC ) 5 MG tablet Take 5 mg by mouth daily. 10/14/18   [provider]  hydroxychloroquine  (PLAQUENIL ) 200 MG tablet Take 200 mg by mouth daily.     [provider]  letrozole (FEMARA) 2.5 MG tablet Take 2.5 mg by mouth daily.    [provider]  metoprolol  (LOPRESSOR ) 100 MG tablet Take 1 tablet (100 mg total) by mouth 2 (two) times daily. 09/01/15   Deforest Fast, MD  ondansetron  (ZOFRAN ) 4 MG tablet Take 1 tablet (4 mg total) by mouth every 6 (six) hours. 09/14/23   Kingsley, Victoria K, DO  oxyCODONE  (ROXICODONE ) 5 MG immediate release tablet Take 1 tablet (5 mg total) by mouth every 6 (six) hours as needed. 09/14/23   Kingsley, Victoria K, DO  ramipril  (ALTACE ) 5 MG capsule Take 15 mg by mouth daily. 12/05/18   [provider]  senna-docusate (SENOKOT-S) 8.6-50 MG tablet Take 1 tablet by mouth daily. 09/14/23   Kingsley, Victoria K, DO      Allergies    Sulfamethoxazole    Review of Systems   Review of Systems  Constitutional:  Positive for appetite change.  Gastrointestinal:  Positive for abdominal pain and nausea.    Physical Exam Updated Vital Signs BP 131/60   Pulse 79   Temp 98.1 F (36.7 C) (Oral)   Resp (!) 25   Ht 5' (1.524 m)   Wt 40.8 kg   SpO2 96%   BMI 17.58 kg/m  Physical Exam Vitals and nursing note reviewed.  Constitutional:      General: She is not in acute distress.    Appearance: She is well-developed. She is ill-appearing.  HENT:     Head: Normocephalic and atraumatic.  Eyes:     Extraocular Movements: Extraocular movements intact.     Conjunctiva/sclera: Conjunctivae normal.  Cardiovascular:     Rate and Rhythm: Normal rate and regular rhythm.     Heart sounds: Normal heart sounds.  Pulmonary:     Effort: Pulmonary effort is normal. No respiratory distress.     Breath sounds: Normal breath sounds.  Abdominal:     Palpations: Abdomen is soft.     Tenderness: There is generalized abdominal tenderness and tenderness in the right lower quadrant. Positive signs include McBurney's sign. Negative signs include Murphy's sign and  Rovsing's sign.  Musculoskeletal:        General: No swelling.     Cervical back: Neck supple.  Skin:    General: Skin is warm and dry.     Capillary Refill: Capillary refill takes less than 2 seconds.  Neurological:     Mental Status: She is alert.  Psychiatric:        Mood and Affect: Mood normal.     ED Results / Procedures / Treatments   Labs (all labs ordered are listed, but only abnormal results are displayed) Labs Reviewed  CBC WITH DIFFERENTIAL/PLATELET - Abnormal; Notable for the following components:      Result Value   Lymphs Abs 0.4 (*)    All other components within normal limits  COMPREHENSIVE METABOLIC PANEL  WITH GFR  LIPASE, BLOOD    EKG EKG Interpretation Date/Time:  Thursday Sep 20 2023 22:54:52 EDT Ventricular Rate:  80 PR Interval:  140 QRS Duration:  83 QT Interval:  402 QTC Calculation: 464 R Axis:   -35  Text Interpretation: Sinus rhythm Left axis deviation Confirmed by Jerald Molly [phone]) on 09/20/2023 11:12:26 PM  Radiology No results found.  Procedures Procedures    Medications Ordered in ED Medications  fentaNYL (SUBLIMAZE) injection 50 mcg (50 mcg Intravenous Given 09/20/23 2332)  ondansetron  (ZOFRAN ) injection 4 mg (4 mg Intravenous Given 09/20/23 2332)    ED Course/ Medical Decision Making/ A&P                                 Medical Decision Making Amount and/or Complexity of Data Reviewed Labs: ordered. Radiology: ordered.  Risk Prescription drug management.   This patient presents to the ED for concern of abdominal pain with nausea, this involves an extensive number of treatment options, and is a complaint that carries with it a high risk of complications and morbidity.  The differential diagnosis includes appendicitis, pancreatitis, choledocholithiasis, acute cholecystitis, SBO, diverticulitis   Co morbidities that complicate the patient evaluation  Pancreatic cysts  Lab Tests:  I Ordered, and personally interpreted labs.  The pertinent results include: CBC WNL   Imaging Studies ordered:  I ordered imaging studies including CT abdomen pelvis with contrast I independently visualized and interpreted imaging which showed pending I agree with the radiologist interpretation   Cardiac Monitoring: / EKG:  The patient was maintained on a cardiac monitor.  I personally viewed and interpreted the cardiac monitored which showed an underlying rhythm of: Sinus rhythm   Problem List / ED Course / Critical interventions / Medication management  I ordered medication including fentanyl and Zofran  for pain and nausea Reevaluation of the patient after  these medicines showed that the patient improved I have reviewed the patients home medicines and have made adjustments as needed   Patient signed out to Atlee Leach, PA-C pending labs and imaging which will determine disposition.  Please refer to their note for full ED course.        Final Clinical Impression(s) / ED Diagnoses Final diagnoses:  None    Rx / DC Orders ED Discharge Orders     None         Merryl Abraham 09/20/23 2354    Arvilla Birmingham, MD 09/21/23 [phone]

## 2023-09-21 ENCOUNTER — Inpatient Hospital Stay (HOSPITAL_COMMUNITY)

## 2023-09-21 ENCOUNTER — Emergency Department (HOSPITAL_COMMUNITY)

## 2023-09-21 DIAGNOSIS — C800 Disseminated malignant neoplasm, unspecified: Secondary | ICD-10-CM | POA: Diagnosis not present

## 2023-09-21 DIAGNOSIS — K859 Acute pancreatitis without necrosis or infection, unspecified: Secondary | ICD-10-CM | POA: Diagnosis present

## 2023-09-21 DIAGNOSIS — Z8249 Family history of ischemic heart disease and other diseases of the circulatory system: Secondary | ICD-10-CM | POA: Diagnosis not present

## 2023-09-21 DIAGNOSIS — Z681 Body mass index (BMI) 19 or less, adult: Secondary | ICD-10-CM | POA: Diagnosis not present

## 2023-09-21 DIAGNOSIS — Z7189 Other specified counseling: Secondary | ICD-10-CM | POA: Diagnosis not present

## 2023-09-21 DIAGNOSIS — R627 Adult failure to thrive: Secondary | ICD-10-CM | POA: Diagnosis present

## 2023-09-21 DIAGNOSIS — R109 Unspecified abdominal pain: Secondary | ICD-10-CM | POA: Diagnosis present

## 2023-09-21 DIAGNOSIS — E278 Other specified disorders of adrenal gland: Secondary | ICD-10-CM | POA: Diagnosis not present

## 2023-09-21 DIAGNOSIS — Z79899 Other long term (current) drug therapy: Secondary | ICD-10-CM | POA: Diagnosis not present

## 2023-09-21 DIAGNOSIS — K862 Cyst of pancreas: Secondary | ICD-10-CM | POA: Diagnosis not present

## 2023-09-21 DIAGNOSIS — R188 Other ascites: Secondary | ICD-10-CM | POA: Diagnosis not present

## 2023-09-21 DIAGNOSIS — R54 Age-related physical debility: Secondary | ICD-10-CM | POA: Diagnosis present

## 2023-09-21 DIAGNOSIS — Z79811 Long term (current) use of aromatase inhibitors: Secondary | ICD-10-CM | POA: Diagnosis not present

## 2023-09-21 DIAGNOSIS — I9589 Other hypotension: Secondary | ICD-10-CM | POA: Diagnosis present

## 2023-09-21 DIAGNOSIS — M8580 Other specified disorders of bone density and structure, unspecified site: Secondary | ICD-10-CM | POA: Diagnosis present

## 2023-09-21 DIAGNOSIS — R748 Abnormal levels of other serum enzymes: Secondary | ICD-10-CM | POA: Diagnosis not present

## 2023-09-21 DIAGNOSIS — Z90710 Acquired absence of both cervix and uterus: Secondary | ICD-10-CM | POA: Diagnosis not present

## 2023-09-21 DIAGNOSIS — Z515 Encounter for palliative care: Secondary | ICD-10-CM | POA: Diagnosis not present

## 2023-09-21 DIAGNOSIS — K861 Other chronic pancreatitis: Secondary | ICD-10-CM | POA: Diagnosis not present

## 2023-09-21 DIAGNOSIS — R1084 Generalized abdominal pain: Secondary | ICD-10-CM | POA: Diagnosis not present

## 2023-09-21 DIAGNOSIS — R1031 Right lower quadrant pain: Secondary | ICD-10-CM | POA: Diagnosis present

## 2023-09-21 DIAGNOSIS — Z87891 Personal history of nicotine dependence: Secondary | ICD-10-CM | POA: Diagnosis not present

## 2023-09-21 DIAGNOSIS — R180 Malignant ascites: Secondary | ICD-10-CM | POA: Diagnosis present

## 2023-09-21 DIAGNOSIS — I10 Essential (primary) hypertension: Secondary | ICD-10-CM | POA: Diagnosis not present

## 2023-09-21 DIAGNOSIS — I7300 Raynaud's syndrome without gangrene: Secondary | ICD-10-CM | POA: Diagnosis present

## 2023-09-21 DIAGNOSIS — Z7983 Long term (current) use of bisphosphonates: Secondary | ICD-10-CM | POA: Diagnosis not present

## 2023-09-21 DIAGNOSIS — M3500 Sicca syndrome, unspecified: Secondary | ICD-10-CM | POA: Diagnosis present

## 2023-09-21 DIAGNOSIS — E43 Unspecified severe protein-calorie malnutrition: Secondary | ICD-10-CM | POA: Diagnosis present

## 2023-09-21 DIAGNOSIS — I872 Venous insufficiency (chronic) (peripheral): Secondary | ICD-10-CM | POA: Diagnosis present

## 2023-09-21 DIAGNOSIS — C259 Malignant neoplasm of pancreas, unspecified: Secondary | ICD-10-CM | POA: Diagnosis present

## 2023-09-21 DIAGNOSIS — E785 Hyperlipidemia, unspecified: Secondary | ICD-10-CM | POA: Diagnosis present

## 2023-09-21 DIAGNOSIS — Z66 Do not resuscitate: Secondary | ICD-10-CM | POA: Diagnosis not present

## 2023-09-21 DIAGNOSIS — R935 Abnormal findings on diagnostic imaging of other abdominal regions, including retroperitoneum: Secondary | ICD-10-CM | POA: Diagnosis not present

## 2023-09-21 DIAGNOSIS — R64 Cachexia: Secondary | ICD-10-CM | POA: Diagnosis present

## 2023-09-21 DIAGNOSIS — C786 Secondary malignant neoplasm of retroperitoneum and peritoneum: Secondary | ICD-10-CM | POA: Diagnosis present

## 2023-09-21 LAB — COMPREHENSIVE METABOLIC PANEL WITH GFR
ALT: 23 U/L (ref 0–44)
AST: 21 U/L (ref 15–41)
Albumin: 2.9 g/dL — ABNORMAL LOW (ref 3.5–5.0)
Alkaline Phosphatase: 64 U/L (ref 38–126)
Anion gap: 8 (ref 5–15)
BUN: 22 mg/dL (ref 8–23)
CO2: 28 mmol/L (ref 22–32)
Calcium: 8.4 mg/dL — ABNORMAL LOW (ref 8.9–10.3)
Chloride: 99 mmol/L (ref 98–111)
Creatinine, Ser: 0.62 mg/dL (ref 0.44–1.00)
GFR, Estimated: >60 mL/min (ref >60)
Glucose, Bld: 142 mg/dL — ABNORMAL HIGH (ref 70–99)
Potassium: 5.1 mmol/L (ref 3.5–5.1)
Sodium: 135 mmol/L (ref 135–145)
Total Bilirubin: 0.5 mg/dL (ref 0.0–1.2)
Total Protein: 6.5 g/dL (ref 6.5–8.1)

## 2023-09-21 LAB — LIPASE, BLOOD: Lipase: 1115 U/L — ABNORMAL HIGH (ref 11–51)

## 2023-09-21 MED ORDER — ACETAMINOPHEN 650 MG RE SUPP: 650.0000 mg | Freq: Four times a day (QID) | RECTAL | Status: DC | PRN

## 2023-09-21 MED ORDER — MORPHINE SULFATE (PF) 4 MG/ML IV SOLN
2.0000 mg | Freq: Once | INTRAVENOUS | Status: AC
  Administered 2023-09-21: 2 mg via INTRAVENOUS
  Filled 2023-09-21: qty 1

## 2023-09-21 MED ORDER — TRAZODONE HCL 50 MG PO TABS
25.0000 mg | ORAL_TABLET | Freq: Every evening | ORAL | Status: DC | PRN
  Administered 2023-09-21 – 2023-09-26 (×5): 25 mg via ORAL
  Filled 2023-09-21 (×5): qty 1

## 2023-09-21 MED ORDER — METOPROLOL TARTRATE 50 MG PO TABS
100.0000 mg | ORAL_TABLET | Freq: Two times a day (BID) | ORAL | Status: DC
  Administered 2023-09-21 – 2023-09-26 (×9): 100 mg via ORAL
  Filled 2023-09-21 (×10): qty 2

## 2023-09-21 MED ORDER — LACTATED RINGERS IV SOLN: INTRAVENOUS | Status: AC

## 2023-09-21 MED ORDER — FENTANYL CITRATE PF 50 MCG/ML IJ SOSY: 25.0000 ug | PREFILLED_SYRINGE | INTRAMUSCULAR | Status: DC | PRN

## 2023-09-21 MED ORDER — ENOXAPARIN SODIUM 30 MG/0.3ML IJ SOSY
30.0000 mg | PREFILLED_SYRINGE | INTRAMUSCULAR | Status: DC
  Administered 2023-09-21: 30 mg via SUBCUTANEOUS
  Filled 2023-09-21: qty 0.3

## 2023-09-21 MED ORDER — OXYCODONE HCL 5 MG PO TABS: 5.0000 mg | ORAL_TABLET | Freq: Four times a day (QID) | ORAL | Status: DC | PRN

## 2023-09-21 MED ORDER — ONDANSETRON HCL 4 MG PO TABS: 4.0000 mg | ORAL_TABLET | Freq: Four times a day (QID) | ORAL | Status: DC | PRN

## 2023-09-21 MED ORDER — RAMIPRIL 5 MG PO CAPS
15.0000 mg | ORAL_CAPSULE | Freq: Every day | ORAL | Status: DC
  Administered 2023-09-21 – 2023-09-24 (×4): 15 mg via ORAL
  Filled 2023-09-21 (×5): qty 3

## 2023-09-21 MED ORDER — IOHEXOL 300 MG/ML  SOLN
75.0000 mL | Freq: Once | INTRAMUSCULAR | Status: AC | PRN
  Administered 2023-09-21: 75 mL via INTRAVENOUS

## 2023-09-21 MED ORDER — ALPRAZOLAM 0.25 MG PO TABS
0.2500 mg | ORAL_TABLET | Freq: Once | ORAL | Status: AC
  Administered 2023-09-21: 0.25 mg via ORAL
  Filled 2023-09-21: qty 1

## 2023-09-21 MED ORDER — GADOBUTROL 1 MMOL/ML IV SOLN
4.0000 mL | Freq: Once | INTRAVENOUS | Status: AC | PRN
  Administered 2023-09-21: 4 mL via INTRAVENOUS

## 2023-09-21 MED ORDER — ALBUTEROL SULFATE (2.5 MG/3ML) 0.083% IN NEBU: 2.5000 mg | INHALATION_SOLUTION | RESPIRATORY_TRACT | Status: DC | PRN

## 2023-09-21 MED ORDER — ROSUVASTATIN CALCIUM 10 MG PO TABS
10.0000 mg | ORAL_TABLET | ORAL | Status: DC
  Administered 2023-09-24: 10 mg via ORAL
  Filled 2023-09-21 (×2): qty 1

## 2023-09-21 MED ORDER — AMLODIPINE BESYLATE 5 MG PO TABS
5.0000 mg | ORAL_TABLET | Freq: Every day | ORAL | Status: DC
  Administered 2023-09-21 – 2023-09-24 (×4): 5 mg via ORAL
  Filled 2023-09-21 (×5): qty 1

## 2023-09-21 MED ORDER — ONDANSETRON HCL 4 MG/2ML IJ SOLN
4.0000 mg | Freq: Once | INTRAMUSCULAR | Status: AC
  Administered 2023-09-21: 4 mg via INTRAVENOUS
  Filled 2023-09-21: qty 2

## 2023-09-21 MED ORDER — SENNOSIDES-DOCUSATE SODIUM 8.6-50 MG PO TABS
1.0000 | ORAL_TABLET | Freq: Every day | ORAL | Status: DC
  Administered 2023-09-21 – 2023-09-22 (×2): 1 via ORAL
  Filled 2023-09-21 (×2): qty 1

## 2023-09-21 MED ORDER — ACETAMINOPHEN 325 MG PO TABS: 650.0000 mg | ORAL_TABLET | Freq: Four times a day (QID) | ORAL | Status: DC | PRN

## 2023-09-21 MED ORDER — HYDROXYCHLOROQUINE SULFATE 200 MG PO TABS
200.0000 mg | ORAL_TABLET | Freq: Every day | ORAL | Status: DC
  Administered 2023-09-21 – 2023-09-23 (×3): 200 mg via ORAL
  Filled 2023-09-21 (×3): qty 1

## 2023-09-21 MED ORDER — ONDANSETRON HCL 4 MG/2ML IJ SOLN
4.0000 mg | Freq: Four times a day (QID) | INTRAMUSCULAR | Status: DC | PRN
Start: 2023-09-21 — End: 2023-09-27
  Administered 2023-09-21 – 2023-09-24 (×4): 4 mg via INTRAVENOUS
  Filled 2023-09-21 (×4): qty 2

## 2023-09-21 MED ORDER — ORAL CARE MOUTH RINSE: 15.0000 mL | OROMUCOSAL | Status: DC | PRN

## 2023-09-21 NOTE — Plan of Care (Signed)
  Problem: Activity: Goal: Risk for activity intolerance will decrease Outcome: Progressing   Problem: Coping: Goal: Level of anxiety will decrease Outcome: Progressing   Problem: Elimination: Goal: Will not experience complications related to bowel motility Outcome: Progressing   Problem: Pain Managment: Goal: General experience of comfort will improve and/or be controlled Outcome: Progressing   Problem: Safety: Goal: Ability to remain free from injury will improve Outcome: Progressing   Problem: Skin Integrity: Goal: Risk for impaired skin integrity will decrease Outcome: Progressing

## 2023-09-21 NOTE — Plan of Care (Signed)
 Pt admitted to unit this shift. Pt oriented to room, daughter at bedside. Problem: Pain Managment: Goal: General experience of comfort will improve and/or be controlled 09/21/2023 0523 by Kevan Peers, RN Outcome: Progressing 09/21/2023 0520 by Kevan Peers, RN Outcome: Progressing   Problem: Safety: Goal: Ability to remain free from injury will improve 09/21/2023 0523 by Kevan Peers, RN Outcome: Progressing 09/21/2023 0520 by Kevan Peers, RN Outcome: Progressing
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
## 2023-09-21 NOTE — Progress Notes (Signed)
 Lipase results are 1115 resulted at 0000 called in from [place] in lab.

## 2023-09-21 NOTE — Procedures (Signed)
  I was present during limited US  of the abdomen to evaluate for ascites.  There is minimal peritoneal fluid with numerous mobile loops of bowel.  There is no adequate pocket of fluid to safely proceed with paracentesis.  Electronically Signed: Lovena Rubinstein, PA-C 09/21/2023, 3:13 PM

## 2023-09-21 NOTE — Progress Notes (Signed)
   09/21/23 1352  TOC Brief Assessment  Insurance and Status Reviewed  Patient has primary care physician Yes  Home environment has been reviewed Single family home  Prior level of function: Independent/mdofied independent  Prior/Current Home Services No current home services  Social Drivers of Health Review SDOH reviewed no interventions necessary  Readmission risk has been reviewed Yes  Transition of care needs transition of care needs identified, TOC will continue to follow

## 2023-09-21 NOTE — ED Provider Notes (Signed)
 Assumed care at shift change.  See prior notes for full H&P.  Briefly, 88 y.o. F here with abdominal pain.  Recent diagnosis of pancreatic cysts, due for outpatient MRCP next month.  Came in today due to worsening pain without nausea/vomiting.  Following with [facility], Dr. Veronda Goody.  Plan:  labs and CT pending.  Results for orders placed or performed during the hospital encounter of 09/20/23  Comprehensive metabolic panel   Collection Time: 09/20/23 11:23 PM  Result Value Ref Range   Sodium 135 135 - 145 mmol/L   Potassium 5.1 3.5 - 5.1 mmol/L   Chloride 99 98 - 111 mmol/L   CO2 28 22 - 32 mmol/L   Glucose, Bld 142 (H) 70 - 99 mg/dL   BUN 22 8 - 23 mg/dL   Creatinine, Ser 1.19 0.44 - 1.00 mg/dL   Calcium  8.4 (L) 8.9 - 10.3 mg/dL   Total Protein 6.5 6.5 - 8.1 g/dL   Albumin 2.9 (L) 3.5 - 5.0 g/dL   AST 21 15 - 41 U/L   ALT 23 0 - 44 U/L   Alkaline Phosphatase 64 38 - 126 U/L   Total Bilirubin 0.5 0.0 - 1.2 mg/dL   GFR, Estimated >14 >78 mL/min   Anion gap 8 5 - 15  Lipase, blood   Collection Time: 09/20/23 11:23 PM  Result Value Ref Range   Lipase 1,360 (H) 11 - 51 U/L  CBC with Differential   Collection Time: 09/20/23 11:23 PM  Result Value Ref Range   WBC 8.9 4.0 - 10.5 K/uL   RBC 4.38 3.87 - 5.11 MIL/uL   Hemoglobin 13.6 12.0 - 15.0 g/dL   HCT 29.5 62.1 - 30.8 %   MCV 96.6 80.0 - 100.0 fL   MCH 31.1 26.0 - 34.0 pg   MCHC 32.2 30.0 - 36.0 g/dL   RDW 65.7 84.6 - 96.2 %   Platelets 267 150 - 400 K/uL   nRBC 0.0 0.0 - 0.2 %   Neutrophils Relative % 85 %   Neutro Abs 7.7 1.7 - 7.7 K/uL   Lymphocytes Relative 5 %   Lymphs Abs 0.4 (L) 0.7 - 4.0 K/uL   Monocytes Relative 8 %   Monocytes Absolute 0.7 0.1 - 1.0 K/uL   Eosinophils Relative 1 %   Eosinophils Absolute 0.1 0.0 - 0.5 K/uL   Basophils Relative 0 %   Basophils Absolute 0.0 0.0 - 0.1 K/uL   Immature Granulocytes 1 %   Abs Immature Granulocytes 0.07 0.00 - 0.07 K/uL   CT ABDOMEN PELVIS W CONTRAST Result Date:  09/21/2023 CLINICAL DATA:  Acute nonlocalized abdominal pain, right lower quadrant abdominal pain EXAM: CT ABDOMEN AND PELVIS WITH CONTRAST TECHNIQUE: Multidetector CT imaging of the abdomen and pelvis was performed using the standard protocol following bolus administration of intravenous contrast. RADIATION DOSE REDUCTION: This exam was performed according to the departmental dose-optimization program which includes automated exposure control, adjustment of the mA and/or kV according to patient size and/or use of iterative reconstruction technique. CONTRAST:  75mL OMNIPAQUE  IOHEXOL  300 MG/ML  SOLN COMPARISON:  None Available. FINDINGS: Lower chest: Trace right pleural effusion with associated basilar atelectasis. No acute abnormality. Small hernia Hepatobiliary: No focal liver abnormality is seen. No gallstones, gallbladder wall thickening, or biliary dilatation. Pancreas: The pancreatic parenchyma is largely replaced by numerous cystic lesions which appears stable since prior examination and are better assessed on prior examination of 04/02/2023. The dominant cystic lesion within the lesser sac demonstrating multiple irregular  internal enhancing septa is again noted measuring roughly 4.1 x 9.6 cm. While this may simply represent a combination of side branch IPMNs or pseudo cystic change, an underlying mucinous cystic neoplasm could appear similarly. There has developed, additionally, progressive ascites with subtle peritoneal enhancement and now loculated fluid within the omentum suspicious for progressive peritoneal carcinomatosis and malignant ascites. Acute pancreatitis with pseudocyst formation could appear similarly, but is considered less likely. Spleen: Unremarkable Adrenals/Urinary Tract: Adrenal glands are unremarkable. Kidneys are normal, without renal calculi, focal lesion, or hydronephrosis. Bladder is unremarkable. Stomach/Bowel: Circumferential wall thickening involving the gastric antrum may reflect  antritis. The stomach, small, large bowel otherwise unremarkable. Appendix normal. No free intraperitoneal gas. Vascular/Lymphatic: Aortic atherosclerosis. No enlarged abdominal or pelvic lymph nodes. Reproductive: Status post hysterectomy. No adnexal masses. Other: No abdominal wall hernia. Mild diffuse subcutaneous body edema. Musculoskeletal: No acute bone abnormality. No lytic or blastic bone lesion. Osseous structures are age appropriate. IMPRESSION: 1. Interval development of progressive ascites with subtle peritoneal enhancement and loculated fluid within the omentum suspicious for progressive peritoneal carcinomatosis and malignant ascites. Acute pancreatitis with pseudocyst formation could appear similarly, but is considered less likely. Diagnostic paracentesis may be helpful for further evaluation. 2. 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 The dominant cystic lesion within the lesser sac demonstrating multiple irregular internal enhancing septa is again noted measuring roughly 4.1 x 9.6 cm. While this may simply represent a combination of side branch IPMNs or pseudo cystic change, an underlying mucinous cystic neoplasm could appear similarly. 3. Circumferential wall thickening involving the gastric antrum may reflect antritis. 4. Mild diffuse subcutaneous body edema. 5. Trace right pleural effusion. Aortic Atherosclerosis (ICD10-I70.0). Electronically Signed   By: Worthy Heads M.D.   On: 09/21/2023 01:03   Lipase 1360, down from prior.  CT unfortunately today with advanced ascites with peritoneal enhancement, concerning for possible malignant ascites.    Results discussed with patient and family, they voiced understanding.  Given she has had change in CT scan within about a week's time, I do think it would be prudent to admit her and obtain MRCP more readily.  MRI not available at this hour but ordered to be done  in the AM.  GI can follow in the morning as well.    Discussed with hospitalist, Dr. [facility]-- will admit for ongoing care.  Secure message sent to [facility] for AM eval.   Coretha Dew, PA-C 09/21/23 0214    Kelsey Patricia, MD 09/21/23 [phone]

## 2023-09-21 NOTE — Progress Notes (Signed)
 Mobility Specialist - Progress Note   09/21/23 0852  Mobility  Activity Ambulated with assistance in hallway  Level of Assistance Standby assist, set-up cues, supervision of patient - no hands on  Assistive Device Front wheel walker  Distance Ambulated (ft) 460 ft  Activity Response Tolerated well  Mobility Referral Yes  Mobility visit 1 Mobility  Mobility Specialist Start Time (ACUTE ONLY) 0835  Mobility Specialist Stop Time (ACUTE ONLY) [id number]  Mobility Specialist Time Calculation (min) (ACUTE ONLY) 17 min   Pt received in bed and agreeable to mobility. No complaints during session. Pt fatigued with session. Pt to bed after session with all needs met.    [facility]
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
## 2023-09-22 DIAGNOSIS — R748 Abnormal levels of other serum enzymes: Secondary | ICD-10-CM | POA: Insufficient documentation

## 2023-09-22 DIAGNOSIS — K862 Cyst of pancreas: Secondary | ICD-10-CM | POA: Diagnosis not present

## 2023-09-22 LAB — CBC
HCT: 39.4 % (ref 36.0–46.0)
Hemoglobin: 12.5 g/dL (ref 12.0–15.0)
MCH: 31.4 pg (ref 26.0–34.0)
MCHC: 31.7 g/dL (ref 30.0–36.0)
MCV: 99 fL (ref 80.0–100.0)
Platelets: 279 10*3/uL (ref 150–400)
RBC: 3.98 MIL/uL (ref 3.87–5.11)
RDW: 14.3 % (ref 11.5–15.5)
WBC: 18.1 10*3/uL — ABNORMAL HIGH (ref 4.0–10.5)
nRBC: 0 % (ref 0.0–0.2)

## 2023-09-22 LAB — COMPREHENSIVE METABOLIC PANEL WITH GFR
ALT: 18 U/L (ref 0–44)
AST: 18 U/L (ref 15–41)
Albumin: 2.5 g/dL — ABNORMAL LOW (ref 3.5–5.0)
Alkaline Phosphatase: 63 U/L (ref 38–126)
Anion gap: 8 (ref 5–15)
BUN: 19 mg/dL (ref 8–23)
CO2: 26 mmol/L (ref 22–32)
Calcium: 8.1 mg/dL — ABNORMAL LOW (ref 8.9–10.3)
Chloride: 102 mmol/L (ref 98–111)
Creatinine, Ser: 0.65 mg/dL (ref 0.44–1.00)
GFR, Estimated: >60 mL/min (ref >60)
Glucose, Bld: 75 mg/dL (ref 70–99)
Potassium: 4.4 mmol/L (ref 3.5–5.1)
Sodium: 136 mmol/L (ref 135–145)
Total Bilirubin: 0.7 mg/dL (ref 0.0–1.2)
Total Protein: 5.7 g/dL — ABNORMAL LOW (ref 6.5–8.1)

## 2023-09-22 MED ORDER — LACTATED RINGERS IV SOLN: INTRAVENOUS | Status: AC

## 2023-09-22 MED ORDER — SENNOSIDES-DOCUSATE SODIUM 8.6-50 MG PO TABS
2.0000 | ORAL_TABLET | Freq: Every day | ORAL | Status: DC
  Administered 2023-09-23 – 2023-09-26 (×4): 2 via ORAL
  Filled 2023-09-22 (×4): qty 2

## 2023-09-22 MED ORDER — BOOST / RESOURCE BREEZE PO LIQD CUSTOM
1.0000 | Freq: Three times a day (TID) | ORAL | Status: DC
  Administered 2023-09-22 – 2023-09-23 (×3): 1 via ORAL

## 2023-09-22 NOTE — Progress Notes (Signed)
 PROGRESS NOTE    Sheila Pugh  ZOX:[id number] DOB: [birth year] DOA: 09/20/2023 PCP: Wyn Heater, MD    Hospital course:  88 yo with numerous cystic lesions in pancreas with elevated lipase concerning for pancreatitis. Also with progressive ascites and signs of possible peritoneal carcinomatosis.  Per GI note, patient needs a diagnostic paracentesis to look for adenocarcinoma and if that is unrevealing either a peritoneal biopsy or EUS with FNA.  Patient seen by Dr. Kimble Pennant who does not feel that this can be done at [facility] system, if this is to be done as an inpatient, patient will need transfer to tertiary care GI center.  Subjective:  Patient seen in conjunction with her attentive and caring grandson.  We went over her imaging and possible diagnoses.  We discussed the recommendations Dr. Kimble Pennant from GI would be seeing her with his recommendations.  Patient is trying to drink liquids but has no real appetite.   Exam:  General: Frail elderly female lying in bed in NAD grandson Eyes: sclera anicteric, conjuctiva mild injection bilaterally CVS: S1-S2, regular  Respiratory:  decreased air entry bilaterally secondary to decreased inspiratory effort, rales at bases  GI: NABS, soft, NT no obvious masses LE: Decreased muscle mass, no edema Neuro: A/O x 3,  grossly nonfocal.  Psych: patient is logical and coherent, judgement and insight appear normal, mood and affect appropriate to situation.  Assessment & Plan:   Elevated lipase/possible pancreatitis Abnormal abdominal CT imaging Concern for pancreatic CA and or peritoneal carcinomatosis Patient with multiple cysts in pancreas, also with possibility of peritoneal carcinomatosis No paracentesis could be done by IR yesterday due to lack of ascitic fluid Patient to see gastroenterology today for consideration of possible EUS Patient remains on clear liquid diet as tolerated Requesting dietary consultation given no appetite, will  place Continue LR at 75  Leukocytosis WBC increased from 9-18 today Patient remains afebrile without malaise or myalgias Unclear if this is reactive or heralds an infectious process Will repeat tomorrow and if persistently elevated will start Zosyn especially given ongoing Plaquenil  use  Sjorgen's syndrome Continue Plaquenil   HTN BP under good control on amlodipine , metoprolol  and ramipril      DVT prophylaxis: SCD Code Status: Full Family Communication: Dietra Fraction was at bedside throughout Disposition: Likely home        Diet Orders (From admission, onward)     Start     Ordered   09/21/23 0230  Diet clear liquid Room service appropriate? Yes; Fluid consistency: Thin  Diet effective now       Question Answer Comment  Room service appropriate? Yes   Fluid consistency: Thin      09/21/23 0229            Objective: Vitals:   09/21/23 1920 09/22/23 0435 09/22/23 0942 09/22/23 1300  BP: (!) 142/66 132/67 125/62 112/60  Pulse: 94 83 81 76  Resp: 15 18    Temp: 99.6 F (37.6 C) 98.3 F (36.8 C)  98.3 F (36.8 C)  TempSrc:    Oral  SpO2: 94% 90%  91%  Weight:      Height:        Intake/Output Summary (Last 24 hours) at 09/22/2023 1742 Last data filed at 09/22/2023 0300 Gross per 24 hour  Intake 1207.57 ml  Output --  Net 1207.57 ml   Filed Weights   09/20/23 2249 09/21/23 0309  Weight: 40.8 kg 42.7 kg    Scheduled Meds:  amLODipine   5 mg Oral Daily  feeding supplement  1 Container Oral TID BM   hydroxychloroquine   200 mg Oral Daily   metoprolol  tartrate  100 mg Oral BID   ramipril   15 mg Oral Daily   [START ON 09/24/2023] rosuvastatin  10 mg Oral Once per day on Monday Thursday   [START ON 09/23/2023] senna-docusate  2 tablet Oral Daily   Continuous Infusions:  lactated ringers      Nutritional status     Body mass index is 18.38 kg/m.  Data Reviewed:   CBC: Recent Labs  Lab 09/20/23 2323 09/22/23 0655  WBC 8.9 18.1*  NEUTROABS 7.7   --   HGB 13.6 12.5  HCT 42.3 39.4  MCV 96.6 99.0  PLT 267 279   Basic Metabolic Panel: Recent Labs  Lab 09/20/23 2323 09/22/23 0655  NA 135 136  K 5.1 4.4  CL 99 102  CO2 28 26  GLUCOSE 142* 75  BUN 22 19  CREATININE 0.62 0.65  CALCIUM  8.4* 8.1*   GFR: Estimated Creatinine Clearance: 34.7 mL/min (by C-G formula based on SCr of 0.65 mg/dL). Liver Function Tests: Recent Labs  Lab 09/20/23 2323 09/22/23 0655  AST 21 18  ALT 23 18  ALKPHOS 64 63  BILITOT 0.5 0.7  PROT 6.5 5.7*  ALBUMIN 2.9* 2.5*   Recent Labs  Lab 09/20/23 2323  LIPASE 1,115*   No results for input(s): "AMMONIA" in the last 168 hours. Coagulation Profile: No results for input(s): "INR", "PROTIME" in the last 168 hours. Cardiac Enzymes: No results for input(s): "CKTOTAL", "CKMB", "CKMBINDEX", "TROPONINI" in the last 168 hours. BNP (last 3 results) No results for input(s): "PROBNP" in the last 8760 hours. HbA1C: No results for input(s): "HGBA1C" in the last 72 hours. CBG: No results for input(s): "GLUCAP" in the last 168 hours. Lipid Profile: No results for input(s): "CHOL", "HDL", "LDLCALC", "TRIG", "CHOLHDL", "LDLDIRECT" in the last 72 hours. Thyroid  Function Tests: No results for input(s): "TSH", "T4TOTAL", "FREET4", "T3FREE", "THYROIDAB" in the last 72 hours. Anemia Panel: No results for input(s): "VITAMINB12", "FOLATE", "FERRITIN", "TIBC", "IRON", "RETICCTPCT" in the last 72 hours. Sepsis Labs: No results for input(s): "PROCALCITON", "LATICACIDVEN" in the last 168 hours.  No results found for this or any previous visit (from the past 240 hours).       Radiology Studies: US  ASCITES (ABDOMEN LIMITED) Result Date: 09/21/2023 CLINICAL DATA:  88 year old female with history of pancreatic cysts, now with recurrent acute pancreatitis, and new onset ascites. IR was requested for diagnostic and therapeutic paracentesis. EXAM: ULTRASOUND ABDOMEN LIMITED COMPARISON:  CT Abdomen Pelvis with  contrast on 09/21/23 FINDINGS: Limited ultrasound done of all 4 quadrants. Trace ascites in the right lower quadrant of the abdomen and left upper quadrant. There is no pocket of fluid large enough to allow for safe approach for paracentesis. IMPRESSION: Small volume ascites without pocket of fluid large enough to allow for safe approach for paracentesis. Ultrasound performed by: Lambert Pillion, PA-C Electronically Signed   By: Elene Griffes M.D.   On: 09/21/2023 15:59   MR ABDOMEN MRCP W WO CONTAST Result Date: 09/21/2023 CLINICAL DATA:  Pancreatic IPMN. EXAM: MRI ABDOMEN WITHOUT AND WITH CONTRAST (INCLUDING MRCP) TECHNIQUE: Multiplanar multisequence MR imaging of the abdomen was performed both before and after the administration of intravenous contrast. Heavily T2-weighted images of the biliary and pancreatic ducts were obtained, and three-dimensional MRCP images were rendered by post processing. CONTRAST:  4mL GADAVIST GADOBUTROL 1 MMOL/ML IV SOLN COMPARISON:  Abdomen pelvis CT from same day. FINDINGS:  Lower chest: Dependent atelectasis in the right lower lobe with tiny right pleural effusion. Hepatobiliary: No suspicious focal abnormality within the liver parenchyma. There is no evidence for gallstones, gallbladder wall thickening, or pericholecystic fluid. No intrahepatic or extrahepatic biliary dilation. Pancreas: As seen on CT earlier today, there are numerous cystic lesions scattered through the pancreatic parenchyma including a cluster of cysts or multicystic lesion in the pancreatic head measuring approximately 2.9 x 2.1 cm. Multiple cystic lesions are seen in the pancreatic tail with a complex cystic lesion measuring on the order of 6.9 x 3.9 cm, corresponding to the 9.6 x 4.2 cm lesion identified at CT. These changes distort pancreatic anatomy in the dominant cystic lesion could be arising from the pancreas or be immediately adjacent to it. Main duct in the head of the pancreas is nondilated but duct is  obscured through the pancreatic tail region. Spleen:  No splenomegaly. No suspicious focal mass lesion. Adrenals/Urinary Tract: 1.6 cm right adrenal nodule shows apparent loss of signal intensity on out of phase T1 imaging suggesting adenoma. Left adrenal gland unremarkable. Kidneys unremarkable. Stomach/Bowel: Stomach is decompressed. Duodenum is normally positioned as is the ligament of Treitz. No small bowel or colonic dilatation within the visualized abdomen. Vascular/Lymphatic: No abdominal aortic aneurysm. No abdominal lymphadenopathy. Other: Mesenteric collection of fluid is seen between the stomach and splenic flexure, potentially loculated. Additional fluid is seen in the left abdomen and around the liver. Postcontrast imaging is motion degraded, but there does appear to be peritoneal enhancement around the fluid in the splenic flexure. Musculoskeletal: No focal suspicious marrow enhancement within the visualized bony anatomy. IMPRESSION: 1. Numerous cystic lesions scattered through the pancreatic parenchyma including a cluster of cysts or multicystic lesion in the pancreatic head measuring approximately 2.9 x 2.1 cm. Multiple cystic lesions are seen in the pancreatic tail with a complex cystic lesion measuring on the order of 6.9 x 3.9 cm, corresponding to the 9.6 x 4.2 cm lesion identified at CT. These changes distort pancreatic anatomy and the dominant cystic lesion could be arising from the pancreas or be immediately adjacent to it. Main duct in the head of the pancreas is nondilated but duct is obscured through the pancreatic tail region. Findings may reflect IPMN or mucinous cystic neoplasm. Pseudocyst not excluded. Consider endoscopic ultrasound with fine-needle aspiration. 2. Mesenteric collection of fluid between the stomach and splenic flexure, potentially loculated. Additional fluid is seen in the left abdomen and around the liver. Postcontrast imaging is motion degraded, but there does appear to  be peritoneal enhancement around the fluid in the splenic flexure. Imaging features are concerning for peritonitis or carcinomatosis. 3. 1.6 cm right adrenal nodule shows apparent loss of signal intensity on out of phase T1 imaging suggesting adenoma. 4. Dependent atelectasis in the right lower lobe with tiny right pleural effusion. Electronically Signed   By: Donnal Fusi M.D.   On: 09/21/2023 08:46   MR 3D Recon At Scanner Result Date: 09/21/2023 CLINICAL DATA:  Pancreatic IPMN. EXAM: MRI ABDOMEN WITHOUT AND WITH CONTRAST (INCLUDING MRCP) TECHNIQUE: Multiplanar multisequence MR imaging of the abdomen was performed both before and after the administration of intravenous contrast. Heavily T2-weighted images of the biliary and pancreatic ducts were obtained, and three-dimensional MRCP images were rendered by post processing. CONTRAST:  4mL GADAVIST GADOBUTROL 1 MMOL/ML IV SOLN COMPARISON:  Abdomen pelvis CT from same day. FINDINGS: Lower chest: Dependent atelectasis in the right lower lobe with tiny right pleural effusion. Hepatobiliary: No suspicious focal abnormality within  the liver parenchyma. There is no evidence for gallstones, gallbladder wall thickening, or pericholecystic fluid. No intrahepatic or extrahepatic biliary dilation. Pancreas: As seen on CT earlier today, there are numerous cystic lesions scattered through the pancreatic parenchyma including a cluster of cysts or multicystic lesion in the pancreatic head measuring approximately 2.9 x 2.1 cm. Multiple cystic lesions are seen in the pancreatic tail with a complex cystic lesion measuring on the order of 6.9 x 3.9 cm, corresponding to the 9.6 x 4.2 cm lesion identified at CT. These changes distort pancreatic anatomy in the dominant cystic lesion could be arising from the pancreas or be immediately adjacent to it. Main duct in the head of the pancreas is nondilated but duct is obscured through the pancreatic tail region. Spleen:  No splenomegaly.  No suspicious focal mass lesion. Adrenals/Urinary Tract: 1.6 cm right adrenal nodule shows apparent loss of signal intensity on out of phase T1 imaging suggesting adenoma. Left adrenal gland unremarkable. Kidneys unremarkable. Stomach/Bowel: Stomach is decompressed. Duodenum is normally positioned as is the ligament of Treitz. No small bowel or colonic dilatation within the visualized abdomen. Vascular/Lymphatic: No abdominal aortic aneurysm. No abdominal lymphadenopathy. Other: Mesenteric collection of fluid is seen between the stomach and splenic flexure, potentially loculated. Additional fluid is seen in the left abdomen and around the liver. Postcontrast imaging is motion degraded, but there does appear to be peritoneal enhancement around the fluid in the splenic flexure. Musculoskeletal: No focal suspicious marrow enhancement within the visualized bony anatomy. IMPRESSION: 1. Numerous cystic lesions scattered through the pancreatic parenchyma including a cluster of cysts or multicystic lesion in the pancreatic head measuring approximately 2.9 x 2.1 cm. Multiple cystic lesions are seen in the pancreatic tail with a complex cystic lesion measuring on the order of 6.9 x 3.9 cm, corresponding to the 9.6 x 4.2 cm lesion identified at CT. These changes distort pancreatic anatomy and the dominant cystic lesion could be arising from the pancreas or be immediately adjacent to it. Main duct in the head of the pancreas is nondilated but duct is obscured through the pancreatic tail region. Findings may reflect IPMN or mucinous cystic neoplasm. Pseudocyst not excluded. Consider endoscopic ultrasound with fine-needle aspiration. 2. Mesenteric collection of fluid between the stomach and splenic flexure, potentially loculated. Additional fluid is seen in the left abdomen and around the liver. Postcontrast imaging is motion degraded, but there does appear to be peritoneal enhancement around the fluid in the splenic flexure.  Imaging features are concerning for peritonitis or carcinomatosis. 3. 1.6 cm right adrenal nodule shows apparent loss of signal intensity on out of phase T1 imaging suggesting adenoma. 4. Dependent atelectasis in the right lower lobe with tiny right pleural effusion. Electronically Signed   By: Donnal Fusi M.D.   On: 09/21/2023 08:46   CT ABDOMEN PELVIS W CONTRAST Result Date: 09/21/2023 CLINICAL DATA:  Acute nonlocalized abdominal pain, right lower quadrant abdominal pain EXAM: CT ABDOMEN AND PELVIS WITH CONTRAST TECHNIQUE: Multidetector CT imaging of the abdomen and pelvis was performed using the standard protocol following bolus administration of intravenous contrast. RADIATION DOSE REDUCTION: This exam was performed according to the departmental dose-optimization program which includes automated exposure control, adjustment of the mA and/or kV according to patient size and/or use of iterative reconstruction technique. CONTRAST:  75mL OMNIPAQUE  IOHEXOL  300 MG/ML  SOLN COMPARISON:  None Available. FINDINGS: Lower chest: Trace right pleural effusion with associated basilar atelectasis. No acute abnormality. Small hernia Hepatobiliary: No focal liver abnormality is seen. No  gallstones, gallbladder wall thickening, or biliary dilatation. Pancreas: The pancreatic parenchyma is largely replaced by numerous cystic lesions which appears stable since prior examination and are better assessed on prior examination of 04/02/2023. The dominant cystic lesion within the lesser sac demonstrating multiple irregular internal enhancing septa is again noted measuring roughly 4.1 x 9.6 cm. While this may simply represent a combination of side branch IPMNs or pseudo cystic change, an underlying mucinous cystic neoplasm could appear similarly. There has developed, additionally, progressive ascites with subtle peritoneal enhancement and now loculated fluid within the omentum suspicious for progressive peritoneal carcinomatosis and  malignant ascites. Acute pancreatitis with pseudocyst formation could appear similarly, but is considered less likely. Spleen: Unremarkable Adrenals/Urinary Tract: Adrenal glands are unremarkable. Kidneys are normal, without renal calculi, focal lesion, or hydronephrosis. Bladder is unremarkable. Stomach/Bowel: Circumferential wall thickening involving the gastric antrum may reflect antritis. The stomach, small, large bowel otherwise unremarkable. Appendix normal. No free intraperitoneal gas. Vascular/Lymphatic: Aortic atherosclerosis. No enlarged abdominal or pelvic lymph nodes. Reproductive: Status post hysterectomy. No adnexal masses. Other: No abdominal wall hernia. Mild diffuse subcutaneous body edema. Musculoskeletal: No acute bone abnormality. No lytic or blastic bone lesion. Osseous structures are age appropriate. IMPRESSION: 1. Interval development of progressive ascites with subtle peritoneal enhancement and loculated fluid within the omentum suspicious for progressive peritoneal carcinomatosis and malignant ascites. Acute pancreatitis with pseudocyst formation could appear similarly, but is considered less likely. Diagnostic paracentesis may be helpful for further evaluation. 2. 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 The dominant cystic lesion within the lesser sac demonstrating multiple irregular internal enhancing septa is again noted measuring roughly 4.1 x 9.6 cm. While this may simply represent a combination of side branch IPMNs or pseudo cystic change, an underlying mucinous cystic neoplasm could appear similarly. 3. Circumferential wall thickening involving the gastric antrum may reflect antritis. 4. Mild diffuse subcutaneous body edema. 5. Trace right pleural effusion. Aortic Atherosclerosis (ICD10-I70.0). Electronically Signed   By: Worthy Heads M.D.   On: 09/21/2023 01:03           LOS: 1 day    Time spent= 35 mins    Magdalene School, MD [facility]  If 7PM-7AM, please contact night-coverage  09/22/2023, 5:42 PM

## 2023-09-22 NOTE — Hospital Course (Addendum)
 Hospital course:  88 yo with numerous cystic lesions in pancreas with elevated lipase concerning for pancreatitis. Also with progressive ascites and signs of possible peritoneal carcinomatosis.  Per GI note, patient needs a diagnostic paracentesis to look for adenocarcinoma and if that is unrevealing either a peritoneal biopsy or EUS with FNA.  Patient seen by Dr. Kimble Pennant who does not feel that this can be done at [facility] system, if this is to be done as an inpatient, patient will need transfer to tertiary care GI center.  Subjective:  Patient seen in conjunction with her attentive and caring grandson.  We went over her imaging and possible diagnoses.  We discussed the recommendations Dr. Kimble Pennant from GI would be seeing her with his recommendations.  Patient is trying to drink liquids but has no real appetite.   Exam:  General: Frail elderly female lying in bed in NAD grandson Eyes: sclera anicteric, conjuctiva mild injection bilaterally CVS: S1-S2, regular  Respiratory:  decreased air entry bilaterally secondary to decreased inspiratory effort, rales at bases  GI: NABS, soft, NT no obvious masses LE: Decreased muscle mass, no edema Neuro: A/O x 3,  grossly nonfocal.  Psych: patient is logical and coherent, judgement and insight appear normal, mood and affect appropriate to situation.  Assessment & Plan:   Elevated lipase/possible pancreatitis Abnormal abdominal CT imaging Concern for pancreatic CA and or peritoneal carcinomatosis Patient with multiple cysts in pancreas, also with possibility of peritoneal carcinomatosis No paracentesis could be done by IR yesterday due to lack of ascitic fluid Patient to see gastroenterology today for consideration of possible EUS Patient remains on clear liquid diet as tolerated Requesting dietary consultation given no appetite, will place Continue LR at 75  Leukocytosis WBC increased from 9-18 today Patient remains afebrile without malaise or  myalgias Unclear if this is reactive or heralds an infectious process Will repeat tomorrow and if persistently elevated will start Zosyn especially given ongoing Plaquenil  use  Sjorgen's syndrome Continue Plaquenil   HTN BP under good control on amlodipine , metoprolol  and ramipril      DVT prophylaxis: SCD Code Status: Full Family Communication: Dietra Fraction was at bedside throughout Disposition: Likely home

## 2023-09-22 NOTE — Plan of Care (Signed)
  Problem: Clinical Measurements: Goal: Ability to maintain clinical measurements within normal limits will improve Outcome: Progressing Goal: Diagnostic test results will improve Outcome: Progressing   Problem: Coping: Goal: Level of anxiety will decrease Outcome: Progressing   Problem: Pain Managment: Goal: General experience of comfort will improve and/or be controlled Outcome: Progressing   Problem: Safety: Goal: Ability to remain free from injury will improve Outcome: Progressing

## 2023-09-22 NOTE — Progress Notes (Signed)
 Subjective: Having trouble eating due to discomfort. Rib discomfort.  Objective: Vital signs in last 24 hours: Temp:  [98.3 F (36.8 C)-99.6 F (37.6 C)] 98.3 F (36.8 C) (05/24 0435) Pulse Rate:  [81-94] 81 (05/24 0942) Resp:  [15-18] 18 (05/24 0435) BP: (125-142)/(62-67) 125/62 (05/24 0942) SpO2:  [90 %-94 %] 90 % (05/24 0435) Weight change:  Last BM Date : 09/19/23  PE: GEN:  Thin and frail-appearing ABD:  Soft, mild tenderness without peritonitis  Lab Results: CBC    Component Value Date/Time   WBC 18.1 (H) 09/22/2023 0655   RBC 3.98 09/22/2023 0655   HGB 12.5 09/22/2023 0655   HCT 39.4 09/22/2023 0655   PLT 279 09/22/2023 0655   MCV 99.0 09/22/2023 0655   MCH 31.4 09/22/2023 0655   MCHC 31.7 09/22/2023 0655   RDW 14.3 09/22/2023 0655   LYMPHSABS 0.4 (L) 09/20/2023 2323   MONOABS 0.7 09/20/2023 2323   EOSABS 0.1 09/20/2023 2323   BASOSABS 0.0 09/20/2023 2323  CMP     Component Value Date/Time   NA 136 09/22/2023 0655   K 4.4 09/22/2023 0655   CL 102 09/22/2023 0655   CO2 26 09/22/2023 0655   GLUCOSE 75 09/22/2023 0655   BUN 19 09/22/2023 0655   CREATININE 0.65 09/22/2023 0655   CALCIUM  8.1 (L) 09/22/2023 0655   PROT 5.7 (L) 09/22/2023 0655   ALBUMIN 2.5 (L) 09/22/2023 0655   AST 18 09/22/2023 0655   ALT 18 09/22/2023 0655   ALKPHOS 63 09/22/2023 0655   BILITOT 0.7 09/22/2023 0655   GFRNONAA >60 09/22/2023 0655    Assessment:   Abdominal discomfort, refractory outpatient management. Markedly elevated serum lipase. Multiple large pancreatic cysts with imaging evidence possible carcinomatosis.  Plan:   Patient thin and frail-appearing.  Even if she has cancer, I don't think she's candidate for any kind of treatment in her current clinical state.  As outpatient, I had previously recommended patient be seen by tertiary center GI center (if further diagnostic work-up still desired) for consideration of EUS (where they have a slimmer EUS scope), and I  still think this is best option.  Moreover, we don't have current lab ability to assess pancreatic cyst fluid.   Management abdominal discomfort supportively. Non febrile but change in WBC yesterday today.  If WBC count not significantly improving tomorrow or she develops fevers, would have low threshold to initiate broad-coverage antibiotics. Clear liquid diet; advance slowly as tolerated. If not making progress regarding diet, or clinically worsens, patient will need inpatient transfer to GI tertiary center.  I do not feel comfortable doing her EUS here for reasons above. [facility] will follow.   Yves Herb 09/22/2023, 1:40 PM   Cell [phone] If no answer or after 5 PM call [phone]

## 2023-09-23 DIAGNOSIS — R188 Other ascites: Secondary | ICD-10-CM

## 2023-09-23 DIAGNOSIS — K862 Cyst of pancreas: Secondary | ICD-10-CM

## 2023-09-23 DIAGNOSIS — Z66 Do not resuscitate: Secondary | ICD-10-CM

## 2023-09-23 DIAGNOSIS — N183 Chronic kidney disease, stage 3 unspecified: Secondary | ICD-10-CM | POA: Insufficient documentation

## 2023-09-23 DIAGNOSIS — R109 Unspecified abdominal pain: Secondary | ICD-10-CM

## 2023-09-23 DIAGNOSIS — R627 Adult failure to thrive: Secondary | ICD-10-CM

## 2023-09-23 DIAGNOSIS — G629 Polyneuropathy, unspecified: Secondary | ICD-10-CM | POA: Insufficient documentation

## 2023-09-23 DIAGNOSIS — Z7189 Other specified counseling: Secondary | ICD-10-CM

## 2023-09-23 DIAGNOSIS — I10 Essential (primary) hypertension: Secondary | ICD-10-CM

## 2023-09-23 DIAGNOSIS — C800 Disseminated malignant neoplasm, unspecified: Secondary | ICD-10-CM

## 2023-09-23 DIAGNOSIS — R748 Abnormal levels of other serum enzymes: Secondary | ICD-10-CM

## 2023-09-23 DIAGNOSIS — F419 Anxiety disorder, unspecified: Secondary | ICD-10-CM | POA: Insufficient documentation

## 2023-09-23 DIAGNOSIS — I7300 Raynaud's syndrome without gangrene: Secondary | ICD-10-CM | POA: Insufficient documentation

## 2023-09-23 LAB — CBC WITH DIFFERENTIAL/PLATELET
Abs Immature Granulocytes: 0.33 10*3/uL — ABNORMAL HIGH (ref 0.00–0.07)
Basophils Absolute: 0 10*3/uL (ref 0.0–0.1)
Basophils Relative: 0 %
Eosinophils Absolute: 0.1 10*3/uL (ref 0.0–0.5)
Eosinophils Relative: 0 %
HCT: 39.4 % (ref 36.0–46.0)
Hemoglobin: 12.9 g/dL (ref 12.0–15.0)
Immature Granulocytes: 2 %
Lymphocytes Relative: 3 %
Lymphs Abs: 0.5 10*3/uL — ABNORMAL LOW (ref 0.7–4.0)
MCH: 31.9 pg (ref 26.0–34.0)
MCHC: 32.7 g/dL (ref 30.0–36.0)
MCV: 97.3 fL (ref 80.0–100.0)
Monocytes Absolute: 1 10*3/uL (ref 0.1–1.0)
Monocytes Relative: 5 %
Neutro Abs: 16.9 10*3/uL — ABNORMAL HIGH (ref 1.7–7.7)
Neutrophils Relative %: 90 %
Platelets: 295 10*3/uL (ref 150–400)
RBC: 4.05 MIL/uL (ref 3.87–5.11)
RDW: 14.3 % (ref 11.5–15.5)
WBC: 18.8 10*3/uL — ABNORMAL HIGH (ref 4.0–10.5)
nRBC: 0 % (ref 0.0–0.2)

## 2023-09-23 LAB — MAGNESIUM: Magnesium: 1.9 mg/dL (ref 1.7–2.4)

## 2023-09-23 LAB — BASIC METABOLIC PANEL WITH GFR
Anion gap: 10 (ref 5–15)
BUN: 26 mg/dL — ABNORMAL HIGH (ref 8–23)
CO2: 24 mmol/L (ref 22–32)
Calcium: 8.2 mg/dL — ABNORMAL LOW (ref 8.9–10.3)
Chloride: 102 mmol/L (ref 98–111)
Creatinine, Ser: 0.72 mg/dL (ref 0.44–1.00)
GFR, Estimated: >60 mL/min (ref >60)
Glucose, Bld: 98 mg/dL (ref 70–99)
Potassium: 3.8 mmol/L (ref 3.5–5.1)
Sodium: 136 mmol/L (ref 135–145)

## 2023-09-23 LAB — LIPASE, BLOOD: Lipase: 196 U/L — ABNORMAL HIGH (ref 11–51)

## 2023-09-23 MED ORDER — ALPRAZOLAM 0.25 MG PO TABS
0.2500 mg | ORAL_TABLET | Freq: Once | ORAL | Status: AC | PRN
  Administered 2023-09-23: 0.25 mg via ORAL
  Filled 2023-09-23: qty 1

## 2023-09-23 MED ORDER — ENSURE ENLIVE PO LIQD
237.0000 mL | Freq: Two times a day (BID) | ORAL | Status: DC
  Administered 2023-09-23 – 2023-09-26 (×6): 237 mL via ORAL

## 2023-09-23 MED ORDER — PIPERACILLIN-TAZOBACTAM 3.375 G IVPB
3.3750 g | Freq: Three times a day (TID) | INTRAVENOUS | Status: DC
  Administered 2023-09-23 – 2023-09-25 (×7): 3.375 g via INTRAVENOUS
  Filled 2023-09-23 (×7): qty 50

## 2023-09-23 NOTE — Progress Notes (Signed)
 Initial Nutrition Assessment  DOCUMENTATION CODES:   Not applicable  INTERVENTION:   Offer Ensure Enlive po BID, each supplement provides 350 kcal and 20 grams of protein.  Magic cup TID with meals, each supplement provides 290 kcal and 9 grams of protein  Calorie count; will follow up on calorie count results    NUTRITION DIAGNOSIS:   Increased nutrient needs related to chronic illness as evidenced by estimated needs.  GOAL:   Patient will meet greater than or equal to 90% of their needs   MONITOR:   PO intake, Supplement acceptance  REASON FOR ASSESSMENT:   Consult Calorie Count  ASSESSMENT:   Pt with PMH of HTN, HLD, Sjogren's disease, known pancreatic cysts, recurrent pancreatitis admitted with abd pain, concern for pancreatic cancer and/or peritoneal carcinomatosis.    Suspect pt meets criteria for malnutrition, will continue to address at follow up    Medications reviewed and include: boost breeze, senokot-s  LR @ 75 ml/hr  Labs reviewed    NUTRITION - FOCUSED PHYSICAL EXAM:  Deferred to follow up  Diet Order:   Diet Order             Diet full liquid Room service appropriate? Yes; Fluid consistency: Thin  Diet effective now                   EDUCATION NEEDS:   Not appropriate for education at this time  Skin:  Skin Assessment: Reviewed RN Assessment  Last BM:  5/24  Height:   Ht Readings from Last 1 Encounters:  09/21/23 5' (1.524 m)    Weight:   Wt Readings from Last 1 Encounters:  09/21/23 42.7 kg    BMI:  Body mass index is 18.38 kg/m.  Estimated Nutritional Needs:   Kcal:  1200-1400  Protein:  65-80 grams  Fluid:  > 1.5 L/day  Randine Butcher., RD, LDN, CNSC See AMiON for contact information

## 2023-09-23 NOTE — IPAL (Signed)
  Interdisciplinary Goals of Care Family Meeting   Date carried out: 09/23/2023  Location of the meeting: Bedside  Member's involved: Physician and Family Member or next of kin, daughter  Durable Power of Attorney or Environmental health practitioner: Patient    Discussion: We discussed goals of care for Sheila Pugh .  After we discussed CODE STATUS including pros and cons of CPR and intubation in light of her age, frailty and comorbidity, patient prefers to be DNR and DNI.  Daughter in agreement.  CODE STATUS updated and RN notified.  Code status:   Code Status: Limited: Do not attempt resuscitation (DNR) -DNR-LIMITED -Do Not Intubate/DNI    Disposition: Continue current acute care  Time spent for the meeting: 30 minutes    Theadore Finger, MD  09/23/2023, 1:57 PM

## 2023-09-23 NOTE — Progress Notes (Signed)
 Subjective: Appetite slightly better  Objective: Vital signs in last 24 hours: Temp:  [98.2 F (36.8 C)-98.6 F (37 C)] 98.2 F (36.8 C) (05/25 0639) Pulse Rate:  [86-90] 86 (05/25 0639) Resp:  [20-28] 20 (05/24 2112) BP: (125-138)/(60-61) 125/60 (05/25 0639) SpO2:  [92 %-93 %] 92 % (05/25 0639) Weight change:  Last BM Date : 09/22/23  PE: GEN:  Thin, frail ABD:  No peritonitis  Lab Results: CBC    Component Value Date/Time   WBC 18.8 (H) 09/23/2023 0622   RBC 4.05 09/23/2023 0622   HGB 12.9 09/23/2023 0622   HCT 39.4 09/23/2023 0622   PLT 295 09/23/2023 0622   MCV 97.3 09/23/2023 0622   MCH 31.9 09/23/2023 0622   MCHC 32.7 09/23/2023 0622   RDW 14.3 09/23/2023 0622   LYMPHSABS 0.5 (L) 09/23/2023 0622   MONOABS 1.0 09/23/2023 0622   EOSABS 0.1 09/23/2023 0622   BASOSABS 0.0 09/23/2023 0622  CMP     Component Value Date/Time   NA 136 09/23/2023 0622   K 3.8 09/23/2023 0622   CL 102 09/23/2023 0622   CO2 24 09/23/2023 0622   GLUCOSE 98 09/23/2023 0622   BUN 26 (H) 09/23/2023 0622   CREATININE 0.72 09/23/2023 0622   CALCIUM  8.2 (L) 09/23/2023 0622   PROT 5.7 (L) 09/22/2023 0655   ALBUMIN 2.5 (L) 09/22/2023 0655   AST 18 09/22/2023 0655   ALT 18 09/22/2023 0655   ALKPHOS 63 09/22/2023 0655   BILITOT 0.7 09/22/2023 0655   GFRNONAA >60 09/23/2023 0622   Assessment:   Abdominal discomfort, refractory outpatient management. Markedly elevated serum lipase. Multiple large pancreatic cysts with imaging evidence possible carcinomatosis.   Plan:   Diet as tolerated. Adding antibiotics. If no sustained improvement, would need transfer to tertiary GI center this admission; if improvement, then consider expedited outpatient EUS tertiary center. [facility] will revisit Tuesday.   Yves Herb 09/23/2023, 2:00 PM   Cell [phone] If no answer or after 5 PM call [phone]

## 2023-09-23 NOTE — Plan of Care (Signed)

## 2023-09-23 NOTE — Progress Notes (Signed)
 PROGRESS NOTE  MCKAYLAH BETTENDORF ZOX:[id number] DOB: 12/12/1935   PCP: Wyn Heater, MD  Patient is from: Home.  Lives with grandson.  Uses rolling walker at baseline.  DOA: 09/20/2023 LOS: 2  Chief complaints Chief Complaint  Patient presents with   Abdominal Pain     Brief Narrative / Interim history: 88 year old F with PMH of numerous pancreatic cysts, breast cancer in remission, HTN, anxiety, Raynaud's, Sjogren's disease and venous insufficiency presenting with abdominal discomfort, elevated lipase and possible progressive ascites.  CT abdomen and pelvis with interval development of progressive ascites with subtotal peritoneal enhancement and loculated fluid within the omentum suspicious for progressive peritoneal carcinomatosis and malignant ascites versus acute pancreatitis with pseudocyst formation.  CT also showed innumerable cystic lesions throughout the pancreas and circumferential wall thickening involving the gastric antrum reflecting antritis.  MRCP confirmed numerous cystic lesion within pancreas which may reflect IPMN or mucinous cystic neoplasm, potentially loculated mesenteric fluid collection between the stomach and the splenic flexure and peritoneal enhancement around the fluid in the splenic flexure concerning for peritonitis or carcinomatosis.  GI consulted and recommended diagnostic paracentesis which which could not be done by IR due to lack of ascitic fluid.  GI recommended follow-up in tertiary center where they have a slimmer EUS scope as previously planned outpatient.  Patient improved clinically.  Lipase trended down.  However, she has persistent leukocytosis.  GI also recommended IV Zosyn for persistent leukocytosis.     Subjective: Seen and examined earlier this morning.  No major events overnight or this morning.  Reports feeling better.  Denies pain but reports some nausea.  No vomiting.  Reports 2 bowel movements.  Denies melena or hematochezia.  Denies chest  pain, dyspnea, cough.  Patient's daughter at bedside.  Discussed CODE STATUS with patient and patient's daughter at bedside.  She wanted to be DNR-Limited.  Objective: Vitals:   09/22/23 1300 09/22/23 2109 09/22/23 2112 09/23/23 0639  BP: 112/60 138/61  125/60  Pulse: 76 90  86  Resp:  (!) 28 20   Temp: 98.3 F (36.8 C) 98.6 F (37 C)  98.2 F (36.8 C)  TempSrc: Oral Oral  Oral  SpO2: 91% 93%  92%  Weight:      Height:        Examination:  GENERAL: No apparent distress.  Appears frail. HEENT: MMM.  Vision and hearing grossly intact.  NECK: Supple.  No apparent JVD.  RESP:  No IWOB.  Fair aeration bilaterally. CVS:  RRR. Heart sounds normal.  ABD/GI/GU: BS+. Abd soft.  Diffuse tenderness.  No rebound or guarding. MSK/EXT:  Moves extremities.  Venous insufficiency SKIN: Skin changes in keeping with venous insufficiency NEURO: Awake, alert and oriented appropriately.  No apparent focal neuro deficit. PSYCH: Calm. Normal affect.   Consultants:  Gastroenterology  Procedures: None  Microbiology summarized: None  Assessment and plan: Elevated lipase/possible pancreatitis Abnormal abdominal CT abdomen/MRCP Multiple large pancreatic cyst this with possible peritoneal carcinomatosis Intra-abdominal fluid collection -Lipase down from 1700-190.  Denies pain but some tenderness.  Still with nausea without emesis. -CT abdomen and pelvis and MRCP as above. -GI recommended para for fluid analysis but no sufficient fluid for Para per IR -GI recommended tertiary center follow-up where they have slimmer EUS scope -GI recommended starting IV Zosyn given persistent leukocytosis -Discussed CODE STATUS-DNR-limited  Nausea without emesis - Antiemetics as needed  Leukocytosis/bandemia -Antibiotics as above -Plaquenil    Sjorgen's syndrome -Hold Plaquenil    Essential hypertension: Normotensive for most part -Continue  amlodipine , metoprolol  and ramipril    History of Raynaud's  disease - Continue amlodipine   Remote history of breast cancer: In remission  Advance care planning-DNR-Limited.  See IPAL note   Physical deconditioning: Uses rolling walker at baseline - PT/OT   Nutrition Body mass index is 18.38 kg/m. Consult dietitian  Nutrition Problem: Increased nutrient needs Etiology: chronic illness Signs/Symptoms: estimated needs Interventions: Ensure Enlive (each supplement provides 350kcal and 20 grams of protein)   DVT prophylaxis:  Place and maintain sequential compression device Start: 09/23/23 1349  Code Status: DNR-Limited Family Communication: Updated patient's daughter at bedside Level of care: Telemetry Status is: Inpatient Remains inpatient appropriate because: Elevated lipase, intra-abdominal fluid, leukocytosis   Final disposition: Home   55 minutes with more than 50% spent in reviewing records, counseling patient/family and coordinating care.   Sch Meds:  Scheduled Meds:  amLODipine   5 mg Oral Daily   feeding supplement  237 mL Oral BID BM   metoprolol  tartrate  100 mg Oral BID   ramipril   15 mg Oral Daily   [START ON 09/24/2023] rosuvastatin  10 mg Oral Once per day on Monday Thursday   senna-docusate  2 tablet Oral Daily   Continuous Infusions:  lactated ringers Stopped (09/23/23 1115)   piperacillin-tazobactam (ZOSYN)  IV 3.375 g (09/23/23 1346)   PRN Meds:.acetaminophen  **OR** acetaminophen , albuterol , fentaNYL (SUBLIMAZE) injection, ondansetron  **OR** ondansetron  (ZOFRAN ) IV, mouth rinse, oxyCODONE , traZODone  Antimicrobials: Anti-infectives (From admission, onward)    Start     Dose/Rate Route Frequency Ordered Stop   09/23/23 1415  piperacillin-tazobactam (ZOSYN) IVPB 3.375 g        3.375 g 12.5 mL/hr over 240 Minutes Intravenous Every 8 hours 09/23/23 1326     09/21/23 1115  hydroxychloroquine  (PLAQUENIL ) tablet 200 mg  Status:  Discontinued        200 mg Oral Daily 09/21/23 1029 09/23/23 1345        I  have personally reviewed the following labs and images: CBC: Recent Labs  Lab 09/20/23 2323 09/22/23 0655 09/23/23 0622  WBC 8.9 18.1* 18.8*  NEUTROABS 7.7  --  16.9*  HGB 13.6 12.5 12.9  HCT 42.3 39.4 39.4  MCV 96.6 99.0 97.3  PLT 267 279 295   BMP &GFR Recent Labs  Lab 09/20/23 2323 09/22/23 0655 09/23/23 0622  NA 135 136 136  K 5.1 4.4 3.8  CL 99 102 102  CO2 28 26 24   GLUCOSE 142* 75 98  BUN 22 19 26*  CREATININE 0.62 0.65 0.72  CALCIUM  8.4* 8.1* 8.2*  MG  --   --  1.9   Estimated Creatinine Clearance: 34.7 mL/min (by C-G formula based on SCr of 0.72 mg/dL). Liver & Pancreas: Recent Labs  Lab 09/20/23 2323 09/22/23 0655  AST 21 18  ALT 23 18  ALKPHOS 64 63  BILITOT 0.5 0.7  PROT 6.5 5.7*  ALBUMIN 2.9* 2.5*   Recent Labs  Lab 09/20/23 2323 09/23/23 0622  LIPASE 1,115* 196*   No results for input(s): "AMMONIA" in the last 168 hours. Diabetic: No results for input(s): "HGBA1C" in the last 72 hours. No results for input(s): "GLUCAP" in the last 168 hours. Cardiac Enzymes: No results for input(s): "CKTOTAL", "CKMB", "CKMBINDEX", "TROPONINI" in the last 168 hours. No results for input(s): "PROBNP" in the last 8760 hours. Coagulation Profile: No results for input(s): "INR", "PROTIME" in the last 168 hours. Thyroid  Function Tests: No results for input(s): "TSH", "T4TOTAL", "FREET4", "T3FREE", "THYROIDAB" in the last 72 hours. Lipid  Profile: No results for input(s): "CHOL", "HDL", "LDLCALC", "TRIG", "CHOLHDL", "LDLDIRECT" in the last 72 hours. Anemia Panel: No results for input(s): "VITAMINB12", "FOLATE", "FERRITIN", "TIBC", "IRON", "RETICCTPCT" in the last 72 hours. Urine analysis:    Component Value Date/Time   COLORURINE YELLOW 09/14/2023 1535   APPEARANCEUR HAZY (A) 09/14/2023 1535   LABSPEC 1.018 09/14/2023 1535   PHURINE 5.0 09/14/2023 1535   GLUCOSEU NEGATIVE 09/14/2023 1535   HGBUR SMALL (A) 09/14/2023 1535   BILIRUBINUR NEGATIVE 09/14/2023  1535   KETONESUR 20 (A) 09/14/2023 1535   PROTEINUR NEGATIVE 09/14/2023 1535   UROBILINOGEN 0.2 09/07/2009 1216   NITRITE NEGATIVE 09/14/2023 1535   LEUKOCYTESUR SMALL (A) 09/14/2023 1535   Sepsis Labs: Invalid input(s): "PROCALCITONIN", "LACTICIDVEN"  Microbiology: No results found for this or any previous visit (from the past 240 hours).  Radiology Studies: No results found.    Dakota Stangl T. Therin Vetsch [facility]  If 7PM-7AM, please contact night-coverage [email or web address] 09/23/2023, 1:49 PM

## 2023-09-23 NOTE — Plan of Care (Signed)
  Problem: Clinical Measurements: Goal: Ability to maintain clinical measurements within normal limits will improve Outcome: Progressing Goal: Diagnostic test results will improve Outcome: Progressing Goal: Respiratory complications will improve Outcome: Progressing Goal: Cardiovascular complication will be avoided Outcome: Progressing   Problem: Pain Managment: Goal: General experience of comfort will improve and/or be controlled Outcome: Progressing

## 2023-09-23 NOTE — Evaluation (Signed)
 Physical Therapy Evaluation Patient Details Name: Sheila Pugh MRN: [id number] DOB: 05-07-1935 Today's Date: 09/23/2023  History of Present Illness  Pt admitted from home 2* recurrent pancreatitis and with hx of pancreatic cysts and Sjogrens Syndrome.  Clinical Impression  Pt admitted as above and presenting with functional mobility limitations 2* generalized weakness, mild balance deficits and decreased activity tolerance.  Pt should progress to dc home with assist of family and will benefit from follow up HHPT to maximize IND and safety at home.        If plan is discharge home, recommend the following: A little help with walking and/or transfers;A little help with bathing/dressing/bathroom;Assistance with cooking/housework;Assist for transportation;Help with stairs or ramp for entrance   Can travel by private vehicle        Equipment Recommendations None recommended by PT  Recommendations for Other Services  OT consult    Functional Status Assessment Patient has had a recent decline in their functional status and demonstrates the ability to make significant improvements in function in a reasonable and predictable amount of time.     Precautions / Restrictions Precautions Precautions: Fall Recall of Precautions/Restrictions: Intact Restrictions Weight Bearing Restrictions Per Provider Order: No      Mobility  Bed Mobility Overal bed mobility: Needs Assistance Bed Mobility: Supine to Sit     Supine to sit: Min assist     General bed mobility comments: assist to bring trunk to upright    Transfers Overall transfer level: Needs assistance Equipment used: Rolling walker (2 wheels) Transfers: Sit to/from Stand Sit to Stand: Min assist           General transfer comment: cues for use of UEs to self assist    Ambulation/Gait Ambulation/Gait assistance: Min assist, Contact guard assist Gait Distance (Feet): 48 Feet (twice) Assistive device: Rolling walker  (2 wheels) Gait Pattern/deviations: Step-to pattern, Step-through pattern, Decreased step length - right, Decreased step length - left, Shuffle, Trunk flexed       General Gait Details: cues for posture and position from [employer]            Wheelchair Mobility     Tilt Bed    Modified Rankin (Stroke Patients Only)       Balance Overall balance assessment: Needs assistance Sitting-balance support: No upper extremity supported, Feet supported Sitting balance-Leahy Scale: Good     Standing balance support: Single extremity supported Standing balance-Leahy Scale: Fair                               Pertinent Vitals/Pain Pain Assessment Pain Assessment: No/denies pain    Home Living Family/patient expects to be discharged to:: Private residence Living Arrangements: Children Available Help at Discharge: Family;Available 24 hours/day Type of Home: House Home Access: Stairs to enter   [employer] of Steps: 1   Home Layout: One level Home Equipment: Agricultural consultant (2 wheels);Cane - single point;BSC/3in1      Prior Function Prior Level of Function : Independent/Modified Independent             Mobility Comments: use of cane most of the time ADLs Comments: Assist of family as needed     Extremity/Trunk Assessment   Upper Extremity Assessment Upper Extremity Assessment: Generalized weakness    Lower Extremity Assessment Lower Extremity Assessment: Generalized weakness (L weaker vs R)    Cervical / Trunk Assessment Cervical / Trunk Assessment: Kyphotic  Communication  Communication Communication: No apparent difficulties    Cognition Arousal: Alert Behavior During Therapy: WFL for tasks assessed/performed   PT - Cognitive impairments: No apparent impairments                         Following commands: Intact       Cueing Cueing Techniques: Verbal cues     General Comments      Exercises      Assessment/Plan    PT Assessment Patient needs continued PT services  PT Problem List Decreased strength;Decreased range of motion;Decreased activity tolerance;Decreased balance;Decreased mobility;Decreased knowledge of use of DME       PT Treatment Interventions DME instruction;Gait training;Stair training;Functional mobility training;Therapeutic activities;Therapeutic exercise;Balance training;Patient/family education    PT Goals (Current goals can be found in the Care Plan section)  Acute Rehab PT Goals Patient Stated Goal: HOME PT Goal Formulation: With patient Time For Goal Achievement: 10/07/23 Potential to Achieve Goals: Good    Frequency Min 3X/week     Co-evaluation               AM-PAC PT "6 Clicks" Mobility  Outcome Measure Help needed turning from your back to your side while in a flat bed without using bedrails?: None Help needed moving from lying on your back to sitting on the side of a flat bed without using bedrails?: A Little Help needed moving to and from a bed to a chair (including a wheelchair)?: A Little Help needed standing up from a chair using your arms (e.g., wheelchair or bedside chair)?: A Little Help needed to walk in hospital room?: A Little Help needed climbing 3-5 steps with a railing? : A Lot 6 Click Score: 18    End of Session Equipment Utilized During Treatment: Gait belt Activity Tolerance: Patient tolerated treatment well;Patient limited by fatigue Patient left: in chair;with call bell/phone within reach;with chair alarm set;with family/visitor present;with nursing/sitter in room Nurse Communication: Mobility status PT Visit Diagnosis: Difficulty in walking, not elsewhere classified (R26.2)    Time: 0950-1020 PT Time Calculation (min) (ACUTE ONLY): 30 min   Charges:   PT Evaluation $PT Eval Low Complexity: 1 Low PT Treatments $Gait Training: 8-22 mins PT General Charges $$ ACUTE PT VISIT: 1 Visit         Thedora Finlay PT Acute Rehabilitation Services Pager [phone] Office [phone]   Daunte Oestreich 09/23/2023, 12:56 PM

## 2023-09-24 DIAGNOSIS — R109 Unspecified abdominal pain: Secondary | ICD-10-CM | POA: Diagnosis not present

## 2023-09-24 DIAGNOSIS — Z66 Do not resuscitate: Secondary | ICD-10-CM | POA: Diagnosis not present

## 2023-09-24 DIAGNOSIS — R627 Adult failure to thrive: Secondary | ICD-10-CM | POA: Diagnosis not present

## 2023-09-24 DIAGNOSIS — R748 Abnormal levels of other serum enzymes: Secondary | ICD-10-CM | POA: Diagnosis not present

## 2023-09-24 LAB — CBC WITH DIFFERENTIAL/PLATELET
Abs Immature Granulocytes: 0.26 10*3/uL — ABNORMAL HIGH (ref 0.00–0.07)
Basophils Absolute: 0 10*3/uL (ref 0.0–0.1)
Basophils Relative: 0 %
Eosinophils Absolute: 0 10*3/uL (ref 0.0–0.5)
Eosinophils Relative: 0 %
HCT: 40 % (ref 36.0–46.0)
Hemoglobin: 12.8 g/dL (ref 12.0–15.0)
Immature Granulocytes: 1 %
Lymphocytes Relative: 2 %
Lymphs Abs: 0.4 10*3/uL — ABNORMAL LOW (ref 0.7–4.0)
MCH: 31.2 pg (ref 26.0–34.0)
MCHC: 32 g/dL (ref 30.0–36.0)
MCV: 97.6 fL (ref 80.0–100.0)
Monocytes Absolute: 0.9 10*3/uL (ref 0.1–1.0)
Monocytes Relative: 5 %
Neutro Abs: 17.9 10*3/uL — ABNORMAL HIGH (ref 1.7–7.7)
Neutrophils Relative %: 92 %
Platelets: 337 10*3/uL (ref 150–400)
RBC: 4.1 MIL/uL (ref 3.87–5.11)
RDW: 14.3 % (ref 11.5–15.5)
WBC: 19.5 10*3/uL — ABNORMAL HIGH (ref 4.0–10.5)
nRBC: 0 % (ref 0.0–0.2)

## 2023-09-24 LAB — COMPREHENSIVE METABOLIC PANEL WITH GFR
ALT: 19 U/L (ref 0–44)
AST: 19 U/L (ref 15–41)
Albumin: 2.3 g/dL — ABNORMAL LOW (ref 3.5–5.0)
Alkaline Phosphatase: 63 U/L (ref 38–126)
Anion gap: 11 (ref 5–15)
BUN: 22 mg/dL (ref 8–23)
CO2: 23 mmol/L (ref 22–32)
Calcium: 8.1 mg/dL — ABNORMAL LOW (ref 8.9–10.3)
Chloride: 99 mmol/L (ref 98–111)
Creatinine, Ser: 0.68 mg/dL (ref 0.44–1.00)
GFR, Estimated: >60 mL/min (ref >60)
Glucose, Bld: 122 mg/dL — ABNORMAL HIGH (ref 70–99)
Potassium: 3.9 mmol/L (ref 3.5–5.1)
Sodium: 133 mmol/L — ABNORMAL LOW (ref 135–145)
Total Bilirubin: 0.6 mg/dL (ref 0.0–1.2)
Total Protein: 5.4 g/dL — ABNORMAL LOW (ref 6.5–8.1)

## 2023-09-24 LAB — PHOSPHORUS: Phosphorus: 3.8 mg/dL (ref 2.5–4.6)

## 2023-09-24 LAB — LIPASE, BLOOD: Lipase: 264 U/L — ABNORMAL HIGH (ref 11–51)

## 2023-09-24 LAB — MAGNESIUM: Magnesium: 2 mg/dL (ref 1.7–2.4)

## 2023-09-24 MED ORDER — PHENYLEPHRINE IN HARD FAT 0.25 % RE SUPP
1.0000 | Freq: Once | RECTAL | Status: AC
  Administered 2023-09-24: 1 via RECTAL
  Filled 2023-09-24: qty 1

## 2023-09-24 NOTE — Progress Notes (Signed)
 Subjective: Feels overall weak. Abdominal cramping.  Objective: Vital signs in last 24 hours: Temp:  [97.7 F (36.5 C)-98.4 F (36.9 C)] 97.9 F (36.6 C) (05/26 1002) Pulse Rate:  [76-98] 79 (05/26 1002) Resp:  [18-20] 18 (05/26 0431) BP: (101-126)/(61-84) 126/61 (05/26 1002) SpO2:  [90 %-95 %] 92 % (05/26 1002) Weight change:  Last BM Date : 09/23/23  PE: GEN:  Thin, frail, weak, deconditioned ABD:  Mild distended, non-tender, no peritonitis  Lab Results: CBC    Component Value Date/Time   WBC 19.5 (H) 09/24/2023 0537   RBC 4.10 09/24/2023 0537   HGB 12.8 09/24/2023 0537   HCT 40.0 09/24/2023 0537   PLT 337 09/24/2023 0537   MCV 97.6 09/24/2023 0537   MCH 31.2 09/24/2023 0537   MCHC 32.0 09/24/2023 0537   RDW 14.3 09/24/2023 0537   LYMPHSABS 0.4 (L) 09/24/2023 0537   MONOABS 0.9 09/24/2023 0537   EOSABS 0.0 09/24/2023 0537   BASOSABS 0.0 09/24/2023 0537  CMP     Component Value Date/Time   NA 133 (L) 09/24/2023 0537   K 3.9 09/24/2023 0537   CL 99 09/24/2023 0537   CO2 23 09/24/2023 0537   GLUCOSE 122 (H) 09/24/2023 0537   BUN 22 09/24/2023 0537   CREATININE 0.68 09/24/2023 0537   CALCIUM  8.1 (L) 09/24/2023 0537   PROT 5.4 (L) 09/24/2023 0537   ALBUMIN 2.3 (L) 09/24/2023 0537   AST 19 09/24/2023 0537   ALT 19 09/24/2023 0537   ALKPHOS 63 09/24/2023 0537   BILITOT 0.6 09/24/2023 0537   GFRNONAA >60 09/24/2023 0537    Assessment:   Abdominal discomfort, improving. Elevated lipase, ? Pancreatitis, downtrending. Large pancreatic cysts with imaging possible carcinomatosis. Weakness, failure-to-thrive. Leukocytosis, slowly progressive.  Plan:   I think patient needs smaller size EUS scope and we don't have readily available capacity for pancreatic cyst fluid analysis.   Not sure why slowly increasing WBC; would continue antibiotics, but on exam I do not suspect peritonitis. Patient does not seem good candidate for any type of treatment but  patient/family would like further evaluation to help obtain definitive diagnosis and to outline possible management options.  As such we will work tomorrow to try to arrange transfer to [facility] [place] where they have specialized expertise in difficult EUS cases as well as possible carcinomatosis. Case reviewed with patient and family at bedside. [facility] will follow.   Yves Herb 09/24/2023, 12:57 PM   Cell [phone] If no answer or after 5 PM call [phone]

## 2023-09-24 NOTE — Progress Notes (Signed)
 PROGRESS NOTE  Sheila Pugh RUE:[id number] DOB: [birth year]   PCP: Wyn Heater, MD  Patient is from: Home.  Lives with grandson.  Uses rolling walker at baseline.  DOA: 09/20/2023 LOS: 3  Chief complaints Chief Complaint  Patient presents with   Abdominal Pain     Brief Narrative / Interim history: 88 year old F with PMH of numerous pancreatic cysts, breast cancer in remission, HTN, anxiety, Raynaud's, Sjogren's disease and venous insufficiency presenting with abdominal discomfort, elevated lipase and possible progressive ascites.   CT abdomen and pelvis with interval development of progressive ascites with subtotal peritoneal enhancement and loculated fluid within the omentum suspicious for progressive peritoneal carcinomatosis and malignant ascites versus acute pancreatitis with pseudocyst formation.  CT also showed innumerable cystic lesions throughout the pancreas and circumferential wall thickening involving the gastric antrum reflecting antritis.    MRCP confirmed numerous cystic lesion within pancreas which may reflect IPMN or mucinous cystic neoplasm, potentially loculated mesenteric fluid collection between the stomach and the splenic flexure and peritoneal enhancement around the fluid in the splenic flexure concerning for peritonitis or carcinomatosis.    GI consulted and recommended diagnostic paracentesis which which could not be done by IR due to lack of ascitic fluid.  Although patient has improved clinically and lipase trended down, she continued to have persistent leukocytosis, even after starting IV Zosyn on 5/25.  GI planning transfer to [facility] where they have specialized expertise in EUS cases.   Subjective: Seen and examined earlier this morning.  No major events overnight or this morning.  She is sleepy but wakes to voice.  Has no complaints other than some nausea.  Patient's grandson at bedside.  Objective: Vitals:   09/23/23 2021 09/24/23 0431 09/24/23  1002 09/24/23 1319  BP: 125/62 101/84 126/61 (!) 108/51  Pulse: 98 76 79 62  Resp: 20 18    Temp: 98.4 F (36.9 C) 97.7 F (36.5 C) 97.9 F (36.6 C) 97.9 F (36.6 C)  TempSrc: Oral Oral  Oral  SpO2: 95% 90% 92% 97%  Weight:      Height:        Examination:  GENERAL: No apparent distress.  Appears frail. HEENT: MMM.  Vision and hearing grossly intact.  NECK: Supple.  No apparent JVD.  RESP:  No IWOB.  Fair aeration bilaterally. CVS:  RRR. Heart sounds normal.  ABD/GI/GU: BS+. Abd soft.  Diffuse tenderness.  No rebound or guarding. MSK/EXT:  Moves extremities.  Venous insufficiency SKIN: Skin changes in keeping with venous insufficiency NEURO: Sleepy but wakes to voice.  Oriented appropriately.  No apparent focal neuro deficit. PSYCH: Calm. Normal affect.   Consultants:  Gastroenterology  Procedures: None  Microbiology summarized: None  Assessment and plan: Elevated lipase/possible pancreatitis Abnormal abdominal CT abdomen/MRCP Multiple large pancreatic cyst this with possible peritoneal carcinomatosis Intra-abdominal fluid collection -Lipase down from 1700-190.  Denies pain but some tenderness.  Still with nausea without emesis. -CT abdomen and pelvis and MRCP as above. -Appreciate GI recommendations:  -Paracentesis for fluid analysis but no sufficient fluid for Para per IR on 5/23 -IV Zosyn started on 5/25>> -Start transfer to [facility] on 5/27 for specialized expertise in difficult EUS -CODE STATUS changed to DNR on 5/25  Nausea without emesis: Continues to endorse intermittent nausea.  No emesis. - Antiemetics as needed  Leukocytosis/bandemia worse despite IV Zosyn. - Continue IV Zosyn   Sjorgen's syndrome -Holding Plaquenil    Essential hypertension: Normotensive for most part -Continue amlodipine , metoprolol  and ramipril   History of Raynaud's disease - Continue amlodipine   Remote history of breast cancer: In remission  Advance care  planning-DNR-Limited.  See IPAL note   Physical deconditioning: Uses rolling walker at baseline - PT/OT  Increased nutrient needs Body mass index is 18.38 kg/m. Nutrition Problem: Increased nutrient needs Etiology: chronic illness Signs/Symptoms: estimated needs Interventions: Ensure Enlive (each supplement provides 350kcal and 20 grams of protein)   DVT prophylaxis:  Place and maintain sequential compression device Start: 09/23/23 1349  Code Status: DNR-Limited Family Communication: Updated patient's grandson at bedside Level of care: Telemetry Status is: Inpatient Remains inpatient appropriate because: Elevated lipase, intra-abdominal fluid, leukocytosis   Final disposition: Likely transfer to [facility]   35 minutes with more than 50% spent in reviewing records, counseling patient/family and coordinating care.   Sch Meds:  Scheduled Meds:  amLODipine   5 mg Oral Daily   feeding supplement  237 mL Oral BID BM   metoprolol  tartrate  100 mg Oral BID   ramipril   15 mg Oral Daily   rosuvastatin   10 mg Oral Once per day on Monday Thursday   senna-docusate  2 tablet Oral Daily   Continuous Infusions:  piperacillin -tazobactam (ZOSYN )  IV 3.375 g (09/24/23 1357)   PRN Meds:.acetaminophen  **OR** acetaminophen , albuterol , fentaNYL  (SUBLIMAZE ) injection, ondansetron  **OR** ondansetron  (ZOFRAN ) IV, mouth rinse, oxyCODONE , traZODone   Antimicrobials: Anti-infectives (From admission, onward)    Start     Dose/Rate Route Frequency Ordered Stop   09/23/23 1415  piperacillin -tazobactam (ZOSYN ) IVPB 3.375 g        3.375 g 12.5 mL/hr over 240 Minutes Intravenous Every 8 hours 09/23/23 1326     09/21/23 1115  hydroxychloroquine  (PLAQUENIL ) tablet 200 mg  Status:  Discontinued        200 mg Oral Daily 09/21/23 1029 09/23/23 1345        I have personally reviewed the following labs and images: CBC: Recent Labs  Lab 09/20/23 2323 09/22/23 0655 09/23/23 0622 09/24/23 0537   WBC 8.9 18.1* 18.8* 19.5*  NEUTROABS 7.7  --  16.9* 17.9*  HGB 13.6 12.5 12.9 12.8  HCT 42.3 39.4 39.4 40.0  MCV 96.6 99.0 97.3 97.6  PLT 267 279 295 337   BMP &GFR Recent Labs  Lab 09/20/23 2323 09/22/23 0655 09/23/23 0622 09/24/23 0537  NA 135 136 136 133*  K 5.1 4.4 3.8 3.9  CL 99 102 102 99  CO2 28 26 24 23   GLUCOSE 142* 75 98 122*  BUN 22 19 26* 22  CREATININE 0.62 0.65 0.72 0.68  CALCIUM  8.4* 8.1* 8.2* 8.1*  MG  --   --  1.9 2.0  PHOS  --   --   --  3.8   Estimated Creatinine Clearance: 34.7 mL/min (by C-G formula based on SCr of 0.68 mg/dL). Liver & Pancreas: Recent Labs  Lab 09/20/23 2323 09/22/23 0655 09/24/23 0537  AST 21 18 19   ALT 23 18 19   ALKPHOS 64 63 63  BILITOT 0.5 0.7 0.6  PROT 6.5 5.7* 5.4*  ALBUMIN 2.9* 2.5* 2.3*   Recent Labs  Lab 09/20/23 2323 09/23/23 0622 09/24/23 0537  LIPASE 1,115* 196* 264*   No results for input(s): "AMMONIA" in the last 168 hours. Diabetic: No results for input(s): "HGBA1C" in the last 72 hours. No results for input(s): "GLUCAP" in the last 168 hours. Cardiac Enzymes: No results for input(s): "CKTOTAL", "CKMB", "CKMBINDEX", "TROPONINI" in the last 168 hours. No results for input(s): "PROBNP" in the last 8760 hours. Coagulation Profile: No  results for input(s): "INR", "PROTIME" in the last 168 hours. Thyroid  Function Tests: No results for input(s): "TSH", "T4TOTAL", "FREET4", "T3FREE", "THYROIDAB" in the last 72 hours. Lipid Profile: No results for input(s): "CHOL", "HDL", "LDLCALC", "TRIG", "CHOLHDL", "LDLDIRECT" in the last 72 hours. Anemia Panel: No results for input(s): "VITAMINB12", "FOLATE", "FERRITIN", "TIBC", "IRON", "RETICCTPCT" in the last 72 hours. Urine analysis:    Component Value Date/Time   COLORURINE YELLOW 09/14/2023 1535   APPEARANCEUR HAZY (A) 09/14/2023 1535   LABSPEC 1.018 09/14/2023 1535   PHURINE 5.0 09/14/2023 1535   GLUCOSEU NEGATIVE 09/14/2023 1535   HGBUR SMALL (A) 09/14/2023  1535   BILIRUBINUR NEGATIVE 09/14/2023 1535   KETONESUR 20 (A) 09/14/2023 1535   PROTEINUR NEGATIVE 09/14/2023 1535   UROBILINOGEN 0.2 09/07/2009 1216   NITRITE NEGATIVE 09/14/2023 1535   LEUKOCYTESUR SMALL (A) 09/14/2023 1535   Sepsis Labs: Invalid input(s): "PROCALCITONIN", "LACTICIDVEN"  Microbiology: No results found for this or any previous visit (from the past 240 hours).  Radiology Studies: No results found.    Irbin Fines T. Toshia Larkin [facility]  If 7PM-7AM, please contact night-coverage [email or web address] 09/24/2023, 3:02 PM

## 2023-09-24 NOTE — Plan of Care (Signed)

## 2023-09-25 DIAGNOSIS — Z515 Encounter for palliative care: Secondary | ICD-10-CM | POA: Diagnosis not present

## 2023-09-25 DIAGNOSIS — Z7189 Other specified counseling: Secondary | ICD-10-CM | POA: Diagnosis not present

## 2023-09-25 DIAGNOSIS — R109 Unspecified abdominal pain: Secondary | ICD-10-CM

## 2023-09-25 MED ORDER — LORAZEPAM 2 MG/ML PO CONC: 1.0000 mg | ORAL | Status: DC | PRN

## 2023-09-25 MED ORDER — MORPHINE SULFATE (CONCENTRATE) 10 MG /0.5 ML PO SOLN: 5.0000 mg | ORAL | Status: DC | PRN

## 2023-09-25 MED ORDER — LORAZEPAM 1 MG PO TABS: 1.0000 mg | ORAL_TABLET | ORAL | Status: DC | PRN

## 2023-09-25 MED ORDER — ALPRAZOLAM 0.25 MG PO TABS
0.2500 mg | ORAL_TABLET | Freq: Once | ORAL | Status: AC | PRN
  Administered 2023-09-25: 0.25 mg via ORAL
  Filled 2023-09-25: qty 1

## 2023-09-25 MED ORDER — LORAZEPAM 2 MG/ML IJ SOLN: 1.0000 mg | INTRAMUSCULAR | Status: DC | PRN

## 2023-09-25 MED ORDER — MORPHINE SULFATE (PF) 2 MG/ML IV SOLN: 1.0000 mg | INTRAVENOUS | Status: DC | PRN

## 2023-09-25 NOTE — Consult Note (Signed)
 Palliative Medicine Inpatient Consult Note  Consulting Provider:  Hoyt Macleod, MD   Reason for consult:   Palliative Care Consult Services Palliative Medicine Consult  Reason for Consult? Goals of care   09/25/2023  HPI:  Per intake H&P --> 88 year old F with PMH of numerous pancreatic cysts, breast cancer in remission, HTN, anxiety, Raynaud's, Sjogren's disease and venous insufficiency presenting with abdominal discomfort, elevated lipase and possible progressive ascites. The PMT team has been asked to support additional goals of care conversations.   Clinical Assessment/Goals of Care:  *Please note that this is a verbal dictation therefore any spelling or grammatical errors are due to the "Dragon Medical One" system interpretation.  I have reviewed medical records including EPIC notes, labs and imaging, received report from bedside RN, assessed the patient who is lying in bed generally uncomfortably.    I met with Aubreigh, her daughter [name], her grandsons Tawni Fat, her Sister Stana Ear, and her brother-in-law Erla Haw to further discuss diagnosis prognosis, GOC, EOL wishes, disposition and options.   I introduced Palliative Medicine as specialized medical care for people living with serious illness. It focuses on providing relief from the symptoms and stress of a serious illness. The goal is to improve quality of life for both the patient and the family.  Medical History Review and Understanding:  A review of Babe's past medical history inclusive of breast cancer, hypertension, anxiety, Raynaud's disease, venous insufficiency, and hypertension was completed.  Social History:  H[place]ah is from [employer] [place] .  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  Her husband passed away after 50 years of marriage.  She has 1 daughter, 2 grandchildren.  Carrin formally worked at a [employer] (medical).  Voula is a woman of [employer].  Functional and Nutritional  State:  Tymara at home was able for the most part to attend to all B ADLs preceding hospitalization she uses a rollator.  Her grandson, Herman Longs has lived with her her home for past 8 years.  Ethan shares he has had to help her with dressing only a few times throughout the years..  She shares she has always had a poor appetite and "eaten like a bird". She has experienced weight loss.   Advance Directives:  A detailed discussion was had today regarding advanced directives.  Emilee's surrogate decision maker is her daughter Xylah Early and grandson Herman Longs Cox.  Code Status:  Concepts specific to code status, artifical feeding and hydration, continued IV antibiotics and rehospitalization was had.  The difference between a aggressive medical intervention path  and a palliative comfort care path for this patient at this time was had.   Jayley is an established DO NOT RESUSCITATE DO NOT INTUBATE CODE STATUS.  Discussion:  Kilyn and her family discussed her declining health state over the past few weeks.  We reviewed her emergency room admissions and hospitalization.  We discussed at home she has had increased gait abnormality, generalized weakness, weight loss and fatigue.  We reviewed Kayda's reason for hospitalization inclusive of abdominal pain.  We discussed the possibilities of what this could be, her grandson used the term pancreatic cancer as a possibility.   I shared what is important from Donneisha's understanding which she finds to be important in terms of quality of life.  We discussed that she can go through transitioning to [facility] additional diagnostic, imaging, laboratory studies though after going through this she may be in a very similar situation and perhaps have a degree of distress from  having gone through such a significant workup.  We reviewed alternative to that we could talk more about what her goals and wishes are at end-of-life.  Patient's daughter [name]  notes that Jalisa sister passed away about 6 months ago so none of this is new to them and sadly they have been through this recently.  I described hospice as a service for patients who have a life expectancy of 6 months or less. The goal of hospice is the preservation of dignity and quality at the end phases of life. Under hospice care, the focus changes from curative to symptom relief.   I asked Carolyne directly what her wishes are after explaining hospice and its role.  She shares that she will need time to speak to her family further to determine the best path forward.  Is evident that she is overwhelmed by the information provided.  Patient's Sister Stana Ear asked with the "doctor" recommendation is.  I shared I would request the primary doctor-Dr. Dahal to speak more to the family about from his impression may be the most reasonable consideration.  I again emphasized that after all is said and done we want to honor what Temesha wants.  Discussed the importance of continued conversation with family and their  medical providers regarding overall plan of care and treatment options, ensuring decisions are within the context of the patients values and GOCs.  Decision Maker: Zahriyah Joo (daughter): [phone]  SUMMARY OF RECOMMENDATIONS   DNAR/DNI  Open and honest conversations held in the setting of patients acute on chronic disease burden  Discussed the option(s) of continued medical workup versus hospice  Caedence plans to speak to her family in regards to what her wishes are moving forward  Plan for ongoing palliative care support  Code Status/Advance Care Planning: DNAR/DNI  Palliative Prophylaxis:  Aspiration, Bowel Regimen, Delirium Protocol, Frequent Pain Assessment, Oral Care, Palliative Wound Care, and Turn Reposition  Additional Recommendations (Limitations, Scope, Preferences): Continue current care  Psycho-social/Spiritual:  Desire for further Chaplaincy  support: Not at this time Additional Recommendations: Education on GOC associated with QOL   Prognosis: Patient declining from a functional and nutritional perspective - fair amount of disease burden placing her at in increase mortality risk in the oncoming 6-12 months.   Discharge Planning: To be determined.   Vitals:   09/25/23 0425 09/25/23 0919  BP: (!) 111/53 (!) 98/41  Pulse: 65 65  Resp:    Temp: (!) 97.4 F (36.3 C)   SpO2: 96%     Intake/Output Summary (Last 24 hours) at 09/25/2023 1404 Last data filed at 09/25/2023 1238 Gross per 24 hour  Intake 201.87 ml  Output --  Net 201.87 ml   Last Weight  Most recent update: 09/21/2023  3:10 AM    Weight  42.7 kg (94 lb 1.6 oz)            Gen:  Elderly Caucasian F cachetic  HEENT: moist mucous membranes CV: Regular rate and rhythm  PULM: Breathing on 2LPM [place], nonlabored ABD:  Generally tender EXT: No edema  Neuro: Alert and oriented x2  PPS: 30-40%   This conversation/these recommendations were discussed with patient primary care team, Dr. Gwynneth Lessen ______________________________________________________ [place] [facility] Palliative Medicine Team Team Cell Phone: [phone] Please utilize secure chat with additional questions, if there is no response within 30 minutes please call the above phone number  Billing based on MDM: High Time: 78  Palliative Medicine Team providers are available by phone from  7am to 7pm daily and can be reached through the team cell phone.  Should this patient require assistance outside of these hours, please call the patient's attending physician.

## 2023-09-25 NOTE — Progress Notes (Signed)
 PT Cancellation Note  Patient Details Name: Sheila Pugh MRN: [id number] DOB: December 09, 1935   Cancelled Treatment:    Reason Eval/Treat Not Completed: Medical issues which prohibited therapy  Per RN, has not been a good day. Will check back another r day.  Abelina Hoes PT Acute Rehabilitation Services Office [phone]  Dareen Ebbing 09/25/2023, 4:35 PM

## 2023-09-25 NOTE — Plan of Care (Addendum)
 VSS. Patient desat'd to 84% on RA. Patient placed back on 2L [place] with O2 sat above 95%. No c/o pain. LBM 5/27. Daughter at bedside. No acute events overnight.  Problem: Education: Goal: Knowledge of General Education information will improve Description: Including pain rating scale, medication(s)/side effects and non-pharmacologic comfort measures Outcome: Progressing   Problem: Health Behavior/Discharge Planning: Goal: Ability to manage health-related needs will improve Outcome: Progressing   Problem: Clinical Measurements: Goal: Ability to maintain clinical measurements within normal limits will improve Outcome: Progressing Goal: Will remain free from infection Outcome: Progressing   Problem: Pain Managment: Goal: General experience of comfort will improve and/or be controlled Outcome: Progressing   Problem: Safety: Goal: Ability to remain free from injury will improve Outcome: Progressing

## 2023-09-25 NOTE — Progress Notes (Addendum)
 PROGRESS NOTE  Sheila Pugh  DOB: 1936-02-20  PCP: Wyn Heater, MD FAO:[id number]  DOA: 09/20/2023  LOS: 4 days  Hospital Day: 6  Brief narrative: Sheila Pugh is a 88 y.o. female with PMH significant for numerous pancreatic cysts, breast cancer in remission, HTN, anxiety, Raynaud's, Sjogren's disease and venous insufficiency  5/22, patient presented with abdominal discomfort, elevated lipase and possible progressive ascites.   CT abdomen and pelvis with interval development of progressive ascites with subtotal peritoneal enhancement and loculated fluid within the omentum suspicious for progressive peritoneal carcinomatosis and malignant ascites versus acute pancreatitis with pseudocyst formation.  CT also showed innumerable cystic lesions throughout the pancreas and circumferential wall thickening involving the gastric antrum reflecting antritis.     MRCP confirmed numerous cystic lesion within pancreas which may reflect IPMN or mucinous cystic neoplasm, potentially loculated mesenteric fluid collection between the stomach and the splenic flexure and peritoneal enhancement around the fluid in the splenic flexure concerning for peritonitis or carcinomatosis.     GI consulted and recommended diagnostic paracentesis which which could not be done by IR due to lack of ascitic fluid.  Although patient has improved clinically and lipase trended down, she continued to have persistent leukocytosis, even after she was started on a course of IV Zosyn on 5/25.   GI recommended transfer to [facility] where they have specialized expertise in EUS cases.  Subjective: Patient was seen and examined this morning. Elderly Caucasian female.  Lying down in bed.  Opens eyes to answer yes/no questions.  Oriented to place and person. Her daughter [name] and grandson [name] were at bedside.  They stated that patient continues to refuse any procedure.  Based on my conversation with them, it is clear  that they would like to take a palliative route which I agree with.  Perative care consultation was called.  Assessment and plan: Likely pancreatic cancer with peritoneal carcinomatosis  Presented with abdominal discomfort  CT scan and MRCP as above raising suspicion of mucinous cystic neoplasm and peritoneal carcinomatosis.  Also had loculated mesenteric fluid collection which was tapped by IR on 5/23.  Fluid volume not enough for cytology To establish a clear diagnosis, she recommended transfer to [facility] for EUS.  However, patient and family wants to take a palliative care route. Currently DNR.  Family to meet with other family members and be definite about hospice Still nauseated and has poor appetite Continue antiemetics as needed   Leukocytosis/bandemia Leukocytosis worsening despite IV Zosyn Recent Labs  Lab 09/20/23 2323 09/22/23 0655 09/23/23 0622 09/24/23 0537  WBC 8.9 18.1* 18.8* 19.5*   Sjorgen's syndrome Holding Plaquenil    Essential hypertension: Normotensive for most part Continue amlodipine , metoprolol  and ramipril     History of Raynaud's disease Continue amlodipine    Remote history of breast cancer: In remission   Physical deconditioning:  Uses rolling walker at baseline PT/OT    Goals of care   Code Status: Limited: Do not attempt resuscitation (DNR) -DNR-LIMITED -Do Not Intubate/DNI      DVT prophylaxis:  Place and maintain sequential compression device Start: 09/23/23 1349   Antimicrobials: IV Zosyn Fluid: None Consultants: GI, palliative care Family Communication: Daughter and grandson at bedside  Status: Inpatient Level of care:  Telemetry   Patient is from: Home Needs to continue in-hospital care: Ongoing workup, determination of further course of action Anticipated d/c to: Pending clinical course   Diet:  Diet Order  Diet full liquid Room service appropriate? Yes; Fluid consistency: Thin  Diet effective now                    Scheduled Meds:  amLODipine   5 mg Oral Daily   feeding supplement  237 mL Oral BID BM   metoprolol  tartrate  100 mg Oral BID   ramipril   15 mg Oral Daily   rosuvastatin  10 mg Oral Once per day on Monday Thursday   senna-docusate  2 tablet Oral Daily    PRN meds: acetaminophen  **OR** acetaminophen , albuterol , fentaNYL (SUBLIMAZE) injection, ondansetron  **OR** ondansetron  (ZOFRAN ) IV, mouth rinse, oxyCODONE , traZODone   Infusions:   piperacillin-tazobactam (ZOSYN)  IV Stopped (09/25/23 0934)    Antimicrobials: Anti-infectives (From admission, onward)    Start     Dose/Rate Route Frequency Ordered Stop   09/23/23 1415  piperacillin-tazobactam (ZOSYN) IVPB 3.375 g        3.375 g 12.5 mL/hr over 240 Minutes Intravenous Every 8 hours 09/23/23 1326     09/21/23 1115  hydroxychloroquine  (PLAQUENIL ) tablet 200 mg  Status:  Discontinued        200 mg Oral Daily 09/21/23 1029 09/23/23 1345       Objective: Vitals:   09/25/23 0919 09/25/23 1437  BP: (!) 98/41 (!) 120/56  Pulse: 65 69  Resp:  14  Temp:  (!) 97.4 F (36.3 C)  SpO2:      Intake/Output Summary (Last 24 hours) at 09/25/2023 1519 Last data filed at 09/25/2023 1238 Gross per 24 hour  Intake 201.87 ml  Output --  Net 201.87 ml   Filed Weights   09/20/23 2249 09/21/23 0309  Weight: 40.8 kg 42.7 kg   Weight change:  Body mass index is 18.38 kg/m.   Physical Exam: General exam: Pleasant, elderly thin built Caucasian female Skin: No rashes, lesions or ulcers. HEENT: Atraumatic, normocephalic, no obvious bleeding Lungs: Clear to auscultation bilaterally,  CVS: S1, S2, no murmur,   GI/Abd: Soft, nontender, nondistended, bowel sound present,   CNS: Somnolent, wakes up answers to questions.  Oriented to place and person Psychiatry: Sad affect Extremities: No pedal edema, no calf tenderness,   Data Review: I have personally reviewed the laboratory data and studies available.  F/u labs  ordered Unresulted Labs (From admission, onward)     Start     Ordered   09/23/23 1353  Cancer antigen 19-9  Add-on,   AD        09/23/23 1352   09/23/23 1353  CA 125  Add-on,   AD        09/23/23 1352   09/23/23 1351  CEA  Add-on,   AD        09/23/23 1352           Signed, Hoyt Macleod, MD [facility] 09/25/2023

## 2023-09-25 NOTE — Significant Event (Signed)
 This afternoon, I called and spoke with her daughter [name] just now. POA Stana Ear was in the room too. Patient family has decided to go comfort care/hospice route.  I have entered comfort care order set.  Hospice consult requested Communicated with GI attending, palliative care and bedside RN.

## 2023-09-25 NOTE — Plan of Care (Signed)
   Problem: Education: Goal: Knowledge of General Education information will improve Description: Including pain rating scale, medication(s)/side effects and non-pharmacologic comfort measures Outcome: Progressing   Problem: Clinical Measurements: Goal: Diagnostic test results will improve Outcome: Progressing   Problem: Nutrition: Goal: Adequate nutrition will be maintained Outcome: Progressing

## 2023-09-26 DIAGNOSIS — R109 Unspecified abdominal pain: Secondary | ICD-10-CM | POA: Diagnosis not present

## 2023-09-26 DIAGNOSIS — Z515 Encounter for palliative care: Secondary | ICD-10-CM | POA: Diagnosis not present

## 2023-09-26 LAB — CEA: CEA: 0.7 ng/mL (ref 0.0–4.7)

## 2023-09-26 LAB — CANCER ANTIGEN 19-9: CA 19-9: 7 U/mL (ref 0–35)

## 2023-09-26 LAB — CA 125: Cancer Antigen (CA) 125: 119 U/mL — ABNORMAL HIGH (ref 0.0–38.1)

## 2023-09-26 MED ORDER — MORPHINE SULFATE (PF) 2 MG/ML IV SOLN
2.0000 mg | Freq: Three times a day (TID) | INTRAVENOUS | Status: DC
  Administered 2023-09-26 (×2): 2 mg via INTRAVENOUS
  Filled 2023-09-26 (×2): qty 1

## 2023-09-30 NOTE — Progress Notes (Signed)
 Nutrition Brief Note  Chart reviewed. Pt now transitioning to comfort care.  No further nutrition interventions planned at this time.  Please re-consult as needed.   Shelle Iron RD, LDN Contact via [employer].

## 2023-09-30 NOTE — Progress Notes (Signed)
   09/02/2023 1500  Spiritual Encounters  Type of Visit Follow up  Care provided to: Pt and family  Referral source Nurse (RN/NT/LPN)  Reason for visit Routine spiritual support  OnCall Visit No  Interventions  Spiritual Care Interventions Made Established relationship of care and support;Compassionate presence;Reflective listening;Normalization of emotions;Narrative/life review;Provided grief education;Encouragement;Prayer  Intervention Outcomes  Outcomes Patient family open to resources;Connection to spiritual care;Awareness around self/spiritual resourses;Awareness of support    Chaplain responded to page request for chaplain presence. Pt was asleep. Family and friends open to spiritual support as indicated above. Chaplains remain available.

## 2023-09-30 NOTE — Progress Notes (Signed)
   09/02/2023 0950  Spiritual Encounters  Type of Visit Initial  Care provided to: Pt and family  Referral source Clinical staff  Reason for visit  (comfort care change)   Per spiritual care consult request by clinical team, I visited with Mrs. Ivonna Kinnick and family.  Mrs. Smullen was seated in bed and enjoying the visit, appearing well and having no distress. She appreciated a chaplain visit.  I offered to return for conversation or prayer later today so that Mrs. Windle could continue visiting with family. I offered compassionate presence and support. I encouraged their memory and story sharing. I let them know that their curse can page the chaplain as needed.  Isabel Ardila L. Minetta Aly, M.Div [phone]

## 2023-09-30 NOTE — Plan of Care (Signed)
  Problem: Clinical Measurements: Goal: Diagnostic test results will improve Outcome: Not Met (comfort care Problem: Health Behavior/Discharge Planning: Goal: Ability to manage health-related needs will improve Outcome: Not Applicable  )

## 2023-09-30 NOTE — Death Summary Note (Signed)
 DEATH SUMMARY   Patient Details  Name: Sheila Pugh MRN: [id number] DOB: 02-27-1936 WUJ:[id number], Mara [place], MD Admission/Discharge Information   Admit Date:  09/16/2023  Date of Death: Date of Death: 02-Oct-2023  Time of Death: Time of Death: 10-06-98  Length of Stay: 6   Hospital Diagnoses: Principal Problem:   Abdominal pain Active Problems:   Failure to thrive in adult   Pancreatic cyst   Elevated lipase   Hypertension   Advance care planning   DNR (do not resuscitate)   Hospital Course: DAVAN HARK is a 88 y.o. female with PMH significant for numerous pancreatic cysts, breast cancer in remission, HTN, anxiety, Raynaud's, Sjogren's disease and venous insufficiency  09/15/2023, patient presented with abdominal discomfort, elevated lipase and possible progressive ascites.   CT abdomen and pelvis with interval development of progressive ascites with subtotal peritoneal enhancement and loculated fluid within the omentum suspicious for progressive peritoneal carcinomatosis and malignant ascites versus acute pancreatitis with pseudocyst formation.  CT also showed innumerable cystic lesions throughout the pancreas and circumferential wall thickening involving the gastric antrum reflecting antritis.     MRCP confirmed numerous cystic lesion within pancreas which may reflect IPMN or mucinous cystic neoplasm, potentially loculated mesenteric fluid collection between the stomach and the splenic flexure and peritoneal enhancement around the fluid in the splenic flexure concerning for peritonitis or carcinomatosis.     GI consulted and recommended diagnostic paracentesis which which could not be done by IR due to lack of ascitic fluid.  Although patient has improved clinically and lipase trended down, she continued to have persistent leukocytosis, even after she was started on a course of IV Zosyn on 5/25.   GI recommended transfer to [facility] where they have specialized expertise in EUS  cases.  Patient continued to decline. CODE STATUS discussed with family.   Given patient's frail clinical condition, patient and family decided to forego any aggressive workup or treatment and chose comfort measures. Comfort care order set in place Palliative care consult appreciated.  Patient passed away in the hospital on Oct 02, 2023 at 2300.  Cause of death hypotension due to poor oral intake due to underlying malignancy       Procedures:   Consultations:   The results of significant diagnostics from this hospitalization (including imaging, microbiology, ancillary and laboratory) are listed below for reference.   Significant Diagnostic Studies: US  ASCITES (ABDOMEN LIMITED) Result Date: 09/21/2023 CLINICAL DATA:  88 year old female with history of pancreatic cysts, now with recurrent acute pancreatitis, and new onset ascites. IR was requested for diagnostic and therapeutic paracentesis. EXAM: ULTRASOUND ABDOMEN LIMITED COMPARISON:  CT Abdomen Pelvis with contrast on 09/21/23 FINDINGS: Limited ultrasound done of all 4 quadrants. Trace ascites in the right lower quadrant of the abdomen and left upper quadrant. There is no pocket of fluid large enough to allow for safe approach for paracentesis. IMPRESSION: Small volume ascites without pocket of fluid large enough to allow for safe approach for paracentesis. Ultrasound performed by: Lambert Pillion, PA-C Electronically Signed   By: Elene Griffes M.D.   On: 09/21/2023 15:59   MR ABDOMEN MRCP W WO CONTAST Result Date: 09/21/2023 CLINICAL DATA:  Pancreatic IPMN. EXAM: MRI ABDOMEN WITHOUT AND WITH CONTRAST (INCLUDING MRCP) TECHNIQUE: Multiplanar multisequence MR imaging of the abdomen was performed both before and after the administration of intravenous contrast. Heavily T2-weighted images of the biliary and pancreatic ducts were obtained, and three-dimensional MRCP images were rendered by post processing. CONTRAST:  4mL GADAVIST GADOBUTROL 1 MMOL/ML  IV SOLN COMPARISON:  Abdomen pelvis CT from same day. FINDINGS: Lower chest: Dependent atelectasis in the right lower lobe with tiny right pleural effusion. Hepatobiliary: No suspicious focal abnormality within the liver parenchyma. There is no evidence for gallstones, gallbladder wall thickening, or pericholecystic fluid. No intrahepatic or extrahepatic biliary dilation. Pancreas: As seen on CT earlier today, there are numerous cystic lesions scattered through the pancreatic parenchyma including a cluster of cysts or multicystic lesion in the pancreatic head measuring approximately 2.9 x 2.1 cm. Multiple cystic lesions are seen in the pancreatic tail with a complex cystic lesion measuring on the order of 6.9 x 3.9 cm, corresponding to the 9.6 x 4.2 cm lesion identified at CT. These changes distort pancreatic anatomy in the dominant cystic lesion could be arising from the pancreas or be immediately adjacent to it. Main duct in the head of the pancreas is nondilated but duct is obscured through the pancreatic tail region. Spleen:  No splenomegaly. No suspicious focal mass lesion. Adrenals/Urinary Tract: 1.6 cm right adrenal nodule shows apparent loss of signal intensity on out of phase T1 imaging suggesting adenoma. Left adrenal gland unremarkable. Kidneys unremarkable. Stomach/Bowel: Stomach is decompressed. Duodenum is normally positioned as is the ligament of Treitz. No small bowel or colonic dilatation within the visualized abdomen. Vascular/Lymphatic: No abdominal aortic aneurysm. No abdominal lymphadenopathy. Other: Mesenteric collection of fluid is seen between the stomach and splenic flexure, potentially loculated. Additional fluid is seen in the left abdomen and around the liver. Postcontrast imaging is motion degraded, but there does appear to be peritoneal enhancement around the fluid in the splenic flexure. Musculoskeletal: No focal suspicious marrow enhancement within the visualized bony anatomy.  IMPRESSION: 1. Numerous cystic lesions scattered through the pancreatic parenchyma including a cluster of cysts or multicystic lesion in the pancreatic head measuring approximately 2.9 x 2.1 cm. Multiple cystic lesions are seen in the pancreatic tail with a complex cystic lesion measuring on the order of 6.9 x 3.9 cm, corresponding to the 9.6 x 4.2 cm lesion identified at CT. These changes distort pancreatic anatomy and the dominant cystic lesion could be arising from the pancreas or be immediately adjacent to it. Main duct in the head of the pancreas is nondilated but duct is obscured through the pancreatic tail region. Findings may reflect IPMN or mucinous cystic neoplasm. Pseudocyst not excluded. Consider endoscopic ultrasound with fine-needle aspiration. 2. Mesenteric collection of fluid between the stomach and splenic flexure, potentially loculated. Additional fluid is seen in the left abdomen and around the liver. Postcontrast imaging is motion degraded, but there does appear to be peritoneal enhancement around the fluid in the splenic flexure. Imaging features are concerning for peritonitis or carcinomatosis. 3. 1.6 cm right adrenal nodule shows apparent loss of signal intensity on out of phase T1 imaging suggesting adenoma. 4. Dependent atelectasis in the right lower lobe with tiny right pleural effusion. Electronically Signed   By: Donnal Fusi M.D.   On: 09/21/2023 08:46   MR 3D Recon At Scanner Result Date: 09/21/2023 CLINICAL DATA:  Pancreatic IPMN. EXAM: MRI ABDOMEN WITHOUT AND WITH CONTRAST (INCLUDING MRCP) TECHNIQUE: Multiplanar multisequence MR imaging of the abdomen was performed both before and after the administration of intravenous contrast. Heavily T2-weighted images of the biliary and pancreatic ducts were obtained, and three-dimensional MRCP images were rendered by post processing. CONTRAST:  4mL GADAVIST GADOBUTROL 1 MMOL/ML IV SOLN COMPARISON:  Abdomen pelvis CT from same day. FINDINGS:  Lower chest: Dependent atelectasis in the right lower lobe  with tiny right pleural effusion. Hepatobiliary: No suspicious focal abnormality within the liver parenchyma. There is no evidence for gallstones, gallbladder wall thickening, or pericholecystic fluid. No intrahepatic or extrahepatic biliary dilation. Pancreas: As seen on CT earlier today, there are numerous cystic lesions scattered through the pancreatic parenchyma including a cluster of cysts or multicystic lesion in the pancreatic head measuring approximately 2.9 x 2.1 cm. Multiple cystic lesions are seen in the pancreatic tail with a complex cystic lesion measuring on the order of 6.9 x 3.9 cm, corresponding to the 9.6 x 4.2 cm lesion identified at CT. These changes distort pancreatic anatomy in the dominant cystic lesion could be arising from the pancreas or be immediately adjacent to it. Main duct in the head of the pancreas is nondilated but duct is obscured through the pancreatic tail region. Spleen:  No splenomegaly. No suspicious focal mass lesion. Adrenals/Urinary Tract: 1.6 cm right adrenal nodule shows apparent loss of signal intensity on out of phase T1 imaging suggesting adenoma. Left adrenal gland unremarkable. Kidneys unremarkable. Stomach/Bowel: Stomach is decompressed. Duodenum is normally positioned as is the ligament of Treitz. No small bowel or colonic dilatation within the visualized abdomen. Vascular/Lymphatic: No abdominal aortic aneurysm. No abdominal lymphadenopathy. Other: Mesenteric collection of fluid is seen between the stomach and splenic flexure, potentially loculated. Additional fluid is seen in the left abdomen and around the liver. Postcontrast imaging is motion degraded, but there does appear to be peritoneal enhancement around the fluid in the splenic flexure. Musculoskeletal: No focal suspicious marrow enhancement within the visualized bony anatomy. IMPRESSION: 1. Numerous cystic lesions scattered through the pancreatic  parenchyma including a cluster of cysts or multicystic lesion in the pancreatic head measuring approximately 2.9 x 2.1 cm. Multiple cystic lesions are seen in the pancreatic tail with a complex cystic lesion measuring on the order of 6.9 x 3.9 cm, corresponding to the 9.6 x 4.2 cm lesion identified at CT. These changes distort pancreatic anatomy and the dominant cystic lesion could be arising from the pancreas or be immediately adjacent to it. Main duct in the head of the pancreas is nondilated but duct is obscured through the pancreatic tail region. Findings may reflect IPMN or mucinous cystic neoplasm. Pseudocyst not excluded. Consider endoscopic ultrasound with fine-needle aspiration. 2. Mesenteric collection of fluid between the stomach and splenic flexure, potentially loculated. Additional fluid is seen in the left abdomen and around the liver. Postcontrast imaging is motion degraded, but there does appear to be peritoneal enhancement around the fluid in the splenic flexure. Imaging features are concerning for peritonitis or carcinomatosis. 3. 1.6 cm right adrenal nodule shows apparent loss of signal intensity on out of phase T1 imaging suggesting adenoma. 4. Dependent atelectasis in the right lower lobe with tiny right pleural effusion. Electronically Signed   By: Donnal Fusi M.D.   On: 09/21/2023 08:46   CT ABDOMEN PELVIS W CONTRAST Result Date: 09/21/2023 CLINICAL DATA:  Acute nonlocalized abdominal pain, right lower quadrant abdominal pain EXAM: CT ABDOMEN AND PELVIS WITH CONTRAST TECHNIQUE: Multidetector CT imaging of the abdomen and pelvis was performed using the standard protocol following bolus administration of intravenous contrast. RADIATION DOSE REDUCTION: This exam was performed according to the departmental dose-optimization program which includes automated exposure control, adjustment of the mA and/or kV according to patient size and/or use of iterative reconstruction technique. CONTRAST:   75mL OMNIPAQUE  IOHEXOL  300 MG/ML  SOLN COMPARISON:  None Available. FINDINGS: Lower chest: Trace right pleural effusion with associated basilar atelectasis. No acute  abnormality. Small hernia Hepatobiliary: No focal liver abnormality is seen. No gallstones, gallbladder wall thickening, or biliary dilatation. Pancreas: The pancreatic parenchyma is largely replaced by numerous cystic lesions which appears stable since prior examination and are better assessed on prior examination of 04/02/2023. The dominant cystic lesion within the lesser sac demonstrating multiple irregular internal enhancing septa is again noted measuring roughly 4.1 x 9.6 cm. While this may simply represent a combination of side branch IPMNs or pseudo cystic change, an underlying mucinous cystic neoplasm could appear similarly. There has developed, additionally, progressive ascites with subtle peritoneal enhancement and now loculated fluid within the omentum suspicious for progressive peritoneal carcinomatosis and malignant ascites. Acute pancreatitis with pseudocyst formation could appear similarly, but is considered less likely. Spleen: Unremarkable Adrenals/Urinary Tract: Adrenal glands are unremarkable. Kidneys are normal, without renal calculi, focal lesion, or hydronephrosis. Bladder is unremarkable. Stomach/Bowel: Circumferential wall thickening involving the gastric antrum may reflect antritis. The stomach, small, large bowel otherwise unremarkable. Appendix normal. No free intraperitoneal gas. Vascular/Lymphatic: Aortic atherosclerosis. No enlarged abdominal or pelvic lymph nodes. Reproductive: Status post hysterectomy. No adnexal masses. Other: No abdominal wall hernia. Mild diffuse subcutaneous body edema. Musculoskeletal: No acute bone abnormality. No lytic or blastic bone lesion. Osseous structures are age appropriate. IMPRESSION: 1. Interval development of progressive ascites with subtle peritoneal enhancement and loculated fluid  within the omentum suspicious for progressive peritoneal carcinomatosis and malignant ascites. Acute pancreatitis with pseudocyst formation could appear similarly, but is considered less likely. Diagnostic paracentesis may be helpful for further evaluation. 2. 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 The dominant cystic lesion within the lesser sac demonstrating multiple irregular internal enhancing septa is again noted measuring roughly 4.1 x 9.6 cm. While this may simply represent a combination of side branch IPMNs or pseudo cystic change, an underlying mucinous cystic neoplasm could appear similarly. 3. Circumferential wall thickening involving the gastric antrum may reflect antritis. 4. Mild diffuse subcutaneous body edema. 5. Trace right pleural effusion. Aortic Atherosclerosis (ICD10-I70.0). Electronically Signed   By: Worthy Heads M.D.   On: 09/21/2023 01:03   CT ABDOMEN PELVIS W CONTRAST Result Date: 09/14/2023 CLINICAL DATA:  Right lower quadrant pain EXAM: CT ABDOMEN AND PELVIS WITH CONTRAST TECHNIQUE: Multidetector CT imaging of the abdomen and pelvis was performed using the standard protocol following bolus administration of intravenous contrast. RADIATION DOSE REDUCTION: This exam was performed according to the departmental dose-optimization program which includes automated exposure control, adjustment of the mA and/or kV according to patient size and/or use of iterative reconstruction technique. CONTRAST:  80mL OMNIPAQUE  IOHEXOL  300 MG/ML  SOLN COMPARISON:  CT 06/28/2021, MRI 08/09/2021, 04/02/2023 FINDINGS: Lower chest: Lung bases demonstrate mild cardiomegaly. No acute airspace disease or pleural effusion. Hepatobiliary: No focal liver abnormality is seen. No gallstones, gallbladder wall thickening, or biliary dilatation. Pancreas: No acute inflammation. Multiple cystic pancreatic lesions are again noted.  Septated lesion at the distal body/tail of pancreas measures about 6.5 x 4.3 by 3.9 cm on series 2 image 20 and series 7, image 44, previously 6 x 3.2 x 3.3 cm. Spleen: Normal in size without focal abnormality. Adrenals/Urinary Tract: Normal left adrenal gland. Right adrenal mass measures 1.5 cm, previously characterized as adenoma on MRI. Kidneys show no hydronephrosis. The bladder is normal Stomach/Bowel: Stomach nonenlarged. No dilated small bowel. Possible wall thickening and mucosal edema involving the transverse colon. Appendix not well seen but no right lower quadrant inflammation is visualized. Vascular/Lymphatic:  Advanced aortic atherosclerosis. No aneurysm. No suspicious lymph nodes Reproductive: Hysterectomy.  No suspicious adnexal mass Other: No free air. New small volume free fluid within the abdomen and pelvis. Interval mild diffuse haziness throughout the mesentery which may be due to mesenteric congestion. No discrete nodularity. Musculoskeletal: Scoliosis and degenerative changes of the spine. Interval moderate superior endplate compression fracture at L4 IMPRESSION: 1. Appendix not well seen but no right lower quadrant inflammatory process is seen. 2. Progressive enlargement of multiple cystic pancreatic lesions including dominant lesion at the distal body and tail of the pancreas, reference MRI 04/02/2023. Dominant lesion does exert some mass effect on the stomach but there is no obstructive change identified 3. Possible wall thickening and mucosal edema involving the transverse colon, question colitis. 4. New small volume ascites within the abdomen and pelvis. Interval diffuse hazy appearance of the mesentery, possibly due to congestion and edema, no discrete nodularity. 5. Interval moderate superior endplate compression fracture at L4. 6. Aortic atherosclerosis. Aortic Atherosclerosis (ICD10-I70.0). Electronically Signed   By: Esmeralda Hedge M.D.   On: 09/14/2023 17:33    Microbiology: No  results found for this or any previous visit (from the past 240 hours).  Signed: Hoyt Macleod, MD 09/14/2023

## 2023-09-30 NOTE — Progress Notes (Signed)
    Patient Name: Sheila Pugh           DOB: 08-Dec-1935  MRN: [id number]       OVERNIGHT EVENT    Notified by RN that patient has expired at 2300.  2 RN verified. Patient was comfort care.   Family is at bedside.     Marque Bango, DNP, ACNPC- AG [facility] [place]

## 2023-09-30 NOTE — TOC Progression Note (Addendum)
 Transition of Care [facility]) - Progression Note    Patient Details  Name: SAFIRA PROFFIT MRN: [id number] Date of Birth: 27-Sep-1935  [facility] CM/SW Contact  Jonni Nettle, LCSW Phone Number: 09/09/2023, 10:00 AM  Clinical Narrative:     ADDENDUM  12:19 PM - CSW received phone call from pt's sister/POA, Keene Pastures [phone], regarding decision of hospice agency. CSW spoke with pt's sister and daughter, Kaytlynn Kochan [phone]. Pt and family would like to receive hospice care through [facility]. Pt's family also interested in residential hospice, specifically at [facility]. CSW sent referral to [facility] hospice liaison, Madelene Schanz. TOC will continue to follow.  CSW spoke with pt's sister/POA, Keene Pastures [phone], via phone call to discuss pt's transition to hospice/comfort care. Pt's sister reports pt and family would like pt to receive hospice/comfort care services. CSW inquired if pt family would like pt to discharge home with hospice or pursue residential hospice. Pt's sister reports family would not be able to care for pt at home. CSW advised of [facility]'s, [facility] in [place] and [facility]'s hospice home in [place] as options for residential hospice. Pt's sister reports she needs to confirm plans with pt's daughter and grandson before making final decision. TOC will continue to follow.   Expected Discharge Plan and Services Hospice services - home or hospice house (TBD)   Social Determinants of Health (SDOH) Interventions SDOH Screenings   Food Insecurity: No Food Insecurity (09/21/2023)  Housing: Low Risk  (09/21/2023)  Transportation Needs: No Transportation Needs (09/21/2023)  Utilities: Not At Risk (09/21/2023)  Social Connections: Moderately Integrated (09/21/2023)  Tobacco Use: Medium Risk (09/20/2023)    Readmission Risk Interventions    09/21/2023    1:52 PM  Readmission Risk Prevention Plan  Post Dischage  Appt Complete  Medication Screening Complete  Transportation Screening Complete    [place], MSW, LCSW 09/22/2023 10:06 AM

## 2023-09-30 NOTE — Progress Notes (Signed)
 PROGRESS NOTE  Sheila Pugh  DOB: 12-29-1935  PCP: Wyn Heater, MD ZOX:[id number]  DOA: 09/20/2023  LOS: 5 days  Hospital Day: 7  Brief narrative: Sheila Pugh is a 88 y.o. female with PMH significant for numerous pancreatic cysts, breast cancer in remission, HTN, anxiety, Raynaud's, Sjogren's disease and venous insufficiency  5/22, patient presented with abdominal discomfort, elevated lipase and possible progressive ascites.   CT abdomen and pelvis with interval development of progressive ascites with subtotal peritoneal enhancement and loculated fluid within the omentum suspicious for progressive peritoneal carcinomatosis and malignant ascites versus acute pancreatitis with pseudocyst formation.  CT also showed innumerable cystic lesions throughout the pancreas and circumferential wall thickening involving the gastric antrum reflecting antritis.     MRCP confirmed numerous cystic lesion within pancreas which may reflect IPMN or mucinous cystic neoplasm, potentially loculated mesenteric fluid collection between the stomach and the splenic flexure and peritoneal enhancement around the fluid in the splenic flexure concerning for peritonitis or carcinomatosis.     GI consulted and recommended diagnostic paracentesis which which could not be done by IR due to lack of ascitic fluid.  Although patient has improved clinically and lipase trended down, she continued to have persistent leukocytosis, even after she was started on a course of IV Zosyn on 5/25.   GI recommended transfer to [facility] where they have specialized expertise in EUS cases.  Subjective: Patient was seen and examined this morning. Elderly Caucasian female.  Not in distress at rest.  Per family, she moans on any movement. Multiple family members outside the room Family and agreed with comfort measures and the plan of hospice. They state they will not be able to provide care at home and hence looking forward for  hospice facility  Assessment and plan: Comfort care status Primarily admitted for abdominal discomfort, workup suggested malignancy Given patient's frail clinical condition, patient and family decided to forego any aggressive workup or treatment and chose comfort measures. 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   Diet:  Diet Order             Diet regular Room service appropriate? Yes; Fluid consistency: Thin  Diet effective now                   Scheduled Meds:  feeding supplement  237 mL Oral BID BM   metoprolol  tartrate  100 mg Oral BID   senna-docusate  2 tablet Oral Daily    PRN meds: acetaminophen  **OR** acetaminophen , albuterol , fentaNYL (SUBLIMAZE) injection, LORazepam **OR** LORazepam **OR** LORazepam, morphine injection, morphine CONCENTRATE **OR** morphine CONCENTRATE, ondansetron  **OR** ondansetron  (ZOFRAN ) IV, mouth rinse, oxyCODONE , traZODone   Infusions:     Antimicrobials: Anti-infectives (From admission, onward)    Start     Dose/Rate Route Frequency Ordered Stop   09/23/23 1415  piperacillin-tazobactam (ZOSYN) IVPB 3.375 g  Status:  Discontinued        3.375  g 12.5 mL/hr over 240 Minutes Intravenous Every 8 hours 09/23/23 1326 09/25/23 1701   09/21/23 1115   hydroxychloroquine  (PLAQUENIL ) tablet 200 mg  Status:  Discontinued        200 mg Oral Daily 09/21/23 1029 09/23/23 1345       Objective: Vitals:   09/19/2023 0026 09/13/2023 0931  BP: (!) 124/51 (!) 124/51  Pulse: 99 99  Resp: 20   Temp: 97.7 F (36.5 C)   SpO2:      Intake/Output Summary (Last 24 hours) at 09/23/2023 1133 Last data filed at 09/25/2023 1238 Gross per 24 hour  Intake 122.12 ml  Output --  Net 122.12 ml   Filed Weights   09/20/23 2249 09/21/23 0309  Weight: 40.8 kg 42.7 kg   Weight change:  Body mass index is 18.38 kg/m.   Physical Exam: General exam: Pleasant, elderly thin built Caucasian female. Comfortable.. I did not do a detailed exam d/t comfort care status.   Data Review: I have personally reviewed the laboratory data and studies available.  F/u labs ordered Unresulted Labs (From admission, onward)    None      Signed, Hoyt Macleod, MD [facility] 09/07/2023

## 2023-09-30 NOTE — Progress Notes (Addendum)
   Palliative Medicine Inpatient Follow Up Note HPI: Sheila Pugh is a 88 y.o. female with PMH significant for numerous pancreatic cysts, breast cancer in remission, HTN, anxiety, Raynaud's, Sjogren's disease and venous insufficiency  5/22, patient presented with abdominal discomfort, elevated lipase and possible progressive ascites.    Today's Discussion 08/31/2023  *Please note that this is a verbal dictation therefore any spelling or grammatical errors are due to the "[employer]" system interpretation.  Chart reviewed inclusive of vital signs, progress notes, laboratory results, and diagnostic images.  I met with Sheila Pugh and her family. We discussed her current clinical state - she has had a rapid decline. She is cyanotic and her respirations are now with long apneic gaps. We discussed the will likely pass in house.    Created space and opportunity for patients family to explore their thoughts feelings and fears regarding her current medical situation.   We discussed that time will likely be short and I anticipate Sheila Pugh will pass in house. We reviewed the idea of making opioids ATC to control symptoms which family is in agreement with.  Questions and concerns addressed/Palliative Support Provided.   Objective Assessment: Vital Signs Vitals:   08/31/2023 0026 09/12/2023 0931  BP: (!) 124/51 (!) 124/51  Pulse: 99 99  Resp: 20   Temp: 97.7 F (36.5 C)   SpO2:     No intake or output data in the 24 hours ending 09/14/2023 1419 Last Weight  Most recent update: 09/21/2023  3:10 AM    Weight  42.7 kg (94 lb 1.6 oz)            Gen:  Elderly Caucasian F cachetic  HEENT: moist mucous membranes CV: Regular rate and rhythm  PULM: Breathing on 2LPM [place], nonlabored ABD:  Generally tender EXT: No edema  Neuro: Alert and oriented x2  SUMMARY OF RECOMMENDATIONS   DNAR/DNI   Comfort care  Morphine 2mg  IVP Q8H ATC  Additional comfort medications per [facility]  Chaplain  support   Plan for ongoing palliative care support ______________________________________________________________________________________ Camille Cedars [place] Palliative Medicine Team Team Cell Phone: [phone] Please utilize secure chat with additional questions, if there is no response within 30 minutes please call the above phone number  Time Spent: 50 Billing based on MDM: High  Palliative Medicine Team providers are available by phone from 7am to 7pm daily and can be reached through the team cell phone.  Should this patient require assistance outside of these hours, please call the patient's attending physician.

## 2023-09-30 NOTE — Progress Notes (Signed)
 PT Cancellation Note  Patient Details Name: Sheila Pugh MRN: [id number] DOB: June 29, 1935   Cancelled Treatment:    Reason Eval/Treat Not Completed: Other (comment) Spoke with PTA.  Pt now comfort care.  PTA spoke with family who request no further PT services.  Will sign off.  Cyd Dowse, PT Acute Rehab [facility] [phone]   Carolynn Citrin 09/14/2023, 1:23 PM

## 2023-09-30 NOTE — Plan of Care (Addendum)
 VSS. Remains on 3L [place]. No c/o pain. Daughter at bedside. Comfort care measures in place. No acute events overnight.  Problem: Education: Goal: Knowledge of General Education information will improve Description: Including pain rating scale, medication(s)/side effects and non-pharmacologic comfort measures Outcome: Progressing   Problem: Clinical Measurements: Goal: Ability to maintain clinical measurements within normal limits will improve Outcome: Progressing   Problem: Safety: Goal: Ability to remain free from injury will improve Outcome: Progressing   Problem: Coping: Goal: Ability to identify and develop effective coping behavior will improve Outcome: Progressing   Problem: Clinical Measurements: Goal: Quality of life will improve Outcome: Progressing   Problem: Role Relationship: Goal: Family's ability to cope with current situation will improve Outcome: Progressing Goal: Ability to verbalize concerns, feelings, and thoughts to partner or family member will improve Outcome: Progressing   Problem: Pain Management: Goal: Satisfaction with pain management regimen will improve Outcome: Progressing

## 2023-09-30 DEATH — deceased

## 2023-10-21 ENCOUNTER — Other Ambulatory Visit
